# Patient Record
Sex: Female | Born: 1975 | Race: Black or African American | Hispanic: No | Marital: Single | Smoking: Never smoker
Health system: Southern US, Community
[De-identification: ages and names within clinical notes are randomized; demographics above are authoritative.]

## PROBLEM LIST (undated history)

## (undated) DIAGNOSIS — D509 Iron deficiency anemia, unspecified: Secondary | ICD-10-CM

## (undated) DIAGNOSIS — F321 Major depressive disorder, single episode, moderate: Secondary | ICD-10-CM

## (undated) DIAGNOSIS — N758 Other diseases of Bartholin's gland: Secondary | ICD-10-CM

## (undated) DIAGNOSIS — Z973 Presence of spectacles and contact lenses: Secondary | ICD-10-CM

## (undated) DIAGNOSIS — N921 Excessive and frequent menstruation with irregular cycle: Secondary | ICD-10-CM

## (undated) DIAGNOSIS — O24419 Gestational diabetes mellitus in pregnancy, unspecified control: Secondary | ICD-10-CM

## (undated) DIAGNOSIS — T8092XA Unspecified transfusion reaction, initial encounter: Secondary | ICD-10-CM

## (undated) DIAGNOSIS — F419 Anxiety disorder, unspecified: Secondary | ICD-10-CM

## (undated) DIAGNOSIS — D72819 Decreased white blood cell count, unspecified: Secondary | ICD-10-CM

## (undated) DIAGNOSIS — T7840XA Allergy, unspecified, initial encounter: Secondary | ICD-10-CM

## (undated) DIAGNOSIS — J45909 Unspecified asthma, uncomplicated: Secondary | ICD-10-CM

## (undated) DIAGNOSIS — Z5189 Encounter for other specified aftercare: Secondary | ICD-10-CM

## (undated) HISTORY — DX: Gestational diabetes mellitus in pregnancy, unspecified control: O24.419

## (undated) HISTORY — PX: COMBINED HYSTEROSCOPY DIAGNOSTIC / D&C: SUR297

## (undated) HISTORY — DX: Iron deficiency anemia, unspecified: D50.9

## (undated) HISTORY — DX: Unspecified asthma, uncomplicated: J45.909

## (undated) HISTORY — DX: Allergy, unspecified, initial encounter: T78.40XA

## (undated) HISTORY — DX: Encounter for other specified aftercare: Z51.89

## (undated) HISTORY — DX: Anxiety disorder, unspecified: F41.9

---

## 1997-03-24 HISTORY — PX: DILATION AND CURETTAGE OF UTERUS: SHX78

## 2002-08-29 ENCOUNTER — Inpatient Hospital Stay (HOSPITAL_COMMUNITY): Admission: EM | Admit: 2002-08-29 | Discharge: 2002-09-01 | Payer: Self-pay | Admitting: Psychiatry

## 2002-08-29 ENCOUNTER — Encounter: Payer: Self-pay | Admitting: Emergency Medicine

## 2002-08-31 ENCOUNTER — Emergency Department (HOSPITAL_COMMUNITY): Admission: EM | Admit: 2002-08-31 | Discharge: 2002-08-31 | Payer: Self-pay | Admitting: Emergency Medicine

## 2002-11-15 ENCOUNTER — Other Ambulatory Visit: Admission: RE | Admit: 2002-11-15 | Discharge: 2002-11-15 | Payer: Self-pay | Admitting: Family Medicine

## 2002-11-21 ENCOUNTER — Ambulatory Visit (HOSPITAL_COMMUNITY): Admission: RE | Admit: 2002-11-21 | Discharge: 2002-11-21 | Payer: Self-pay | Admitting: Family Medicine

## 2002-11-21 ENCOUNTER — Encounter: Payer: Self-pay | Admitting: Family Medicine

## 2003-05-29 ENCOUNTER — Emergency Department (HOSPITAL_COMMUNITY): Admission: EM | Admit: 2003-05-29 | Discharge: 2003-05-29 | Payer: Self-pay | Admitting: Emergency Medicine

## 2004-05-08 ENCOUNTER — Other Ambulatory Visit: Admission: RE | Admit: 2004-05-08 | Discharge: 2004-05-08 | Payer: Self-pay | Admitting: Obstetrics and Gynecology

## 2004-05-31 ENCOUNTER — Ambulatory Visit (HOSPITAL_COMMUNITY): Admission: RE | Admit: 2004-05-31 | Discharge: 2004-05-31 | Payer: Self-pay | Admitting: Obstetrics and Gynecology

## 2004-08-02 ENCOUNTER — Encounter (HOSPITAL_COMMUNITY): Admission: AD | Admit: 2004-08-02 | Discharge: 2004-09-01 | Payer: Self-pay | Admitting: Obstetrics and Gynecology

## 2004-08-26 ENCOUNTER — Ambulatory Visit (HOSPITAL_COMMUNITY): Admission: RE | Admit: 2004-08-26 | Discharge: 2004-08-26 | Payer: Self-pay | Admitting: Obstetrics and Gynecology

## 2004-09-06 ENCOUNTER — Encounter (HOSPITAL_COMMUNITY): Admission: AD | Admit: 2004-09-06 | Discharge: 2004-10-06 | Payer: Self-pay | Admitting: Obstetrics and Gynecology

## 2004-10-04 ENCOUNTER — Inpatient Hospital Stay (HOSPITAL_COMMUNITY): Admission: AD | Admit: 2004-10-04 | Discharge: 2004-10-04 | Payer: Self-pay | Admitting: Obstetrics and Gynecology

## 2004-10-11 ENCOUNTER — Inpatient Hospital Stay: Admission: AD | Admit: 2004-10-11 | Discharge: 2004-10-11 | Payer: Self-pay | Admitting: Obstetrics and Gynecology

## 2004-10-18 ENCOUNTER — Encounter (HOSPITAL_COMMUNITY): Admission: RE | Admit: 2004-10-18 | Discharge: 2004-11-17 | Payer: Self-pay | Admitting: Obstetrics and Gynecology

## 2004-11-22 ENCOUNTER — Encounter (HOSPITAL_COMMUNITY): Admission: AD | Admit: 2004-11-22 | Discharge: 2004-12-13 | Payer: Self-pay | Admitting: Obstetrics and Gynecology

## 2005-01-17 ENCOUNTER — Encounter (INDEPENDENT_AMBULATORY_CARE_PROVIDER_SITE_OTHER): Payer: Self-pay | Admitting: *Deleted

## 2005-01-17 ENCOUNTER — Inpatient Hospital Stay (HOSPITAL_COMMUNITY): Admission: RE | Admit: 2005-01-17 | Discharge: 2005-01-20 | Payer: Self-pay | Admitting: Obstetrics and Gynecology

## 2009-07-15 ENCOUNTER — Emergency Department (HOSPITAL_COMMUNITY)
Admission: EM | Admit: 2009-07-15 | Discharge: 2009-07-16 | Payer: Self-pay | Source: Home / Self Care | Admitting: Emergency Medicine

## 2009-07-16 ENCOUNTER — Emergency Department (HOSPITAL_COMMUNITY)
Admission: EM | Admit: 2009-07-16 | Discharge: 2009-07-16 | Payer: Self-pay | Source: Home / Self Care | Admitting: Family Medicine
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
## 2010-08-09 NOTE — H&P (Signed)
[name], [name]               ACCOUNT NO.:  [id number]   MEDICAL RECORD NO.:  [id number]          PATIENT TYPE:  INP   LOCATION:  NA                            [facility]  [facility]   PHYSICIAN:  Charles A. Delcambre, MDDATE OF BIRTH:  09-13-1975   DATE OF ADMISSION:  DATE OF DISCHARGE:                                HISTORY & PHYSICAL   The patient is to be admitted on 01/17/05 to undergo repeat cesarean section  with tubal ligation.  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  I followed up with Dr. __________  through 36 weeks and neonatal testing with NST twice a week and biophysical  profile once a week.  She gives informed consent except for some infection,  bleeding, bowel and bladder damage, blood product risk including hepatitis  and HIV exposure, ureteral damage, failure risk of tubal ligation  approximately 1 in 400.  Alternative forms of contraception have been  discussed.  She is aware of permits by design of this procedure and  irreversibility by design.  All questions are answered.  She gives informed  consent in this regard.   PAST MEDICAL HISTORY:  History of gestational diabetes with prior pregnancy,  not currently.   PAST SURGICAL HISTORY:  Previous cesarean section x2, SVD x2, induction of  labor for fetal anomaly x1.   MEDICATIONS:  Prenatal vitamins plus iron.   ALLERGIES:  __________, reaction not specified.   SOCIAL HISTORY:  Married.  No tobacco, ethanol, or drug use.   FAMILY HISTORY:  Chronic hypertension, tuberculosis, diabetes.  Otherwise,  no major illnesses.   REVIEW OF SYSTEMS:  No chest pain, shortness of breath, wheezing, diarrhea,  or constipation, urge, frequency, dysuria.  No chest pain, no headaches,  scotomata, right upper quadrant pain, blurred  vision.   PHYSICAL EXAMINATION:  Alert and oriented x3.  Weight 249 pounds.  Blood  pressure 124/60, respirations 18, pulse 90, fetal heart rate today 140s.  Fundal height 37.  HEENT exam grossly within normal limits.  Neck is supple  without thyromegaly or adenopathy.  Lungs are clear bilaterally.  Back:  No  CVA tenderness.  Breasts:  No masses, tenderness, or discharge, skin or  nipple changes bilaterally.  Abdomen:  Fundal height of 37 and gravid.  Perineum normal external female genitalia.  Cervix closed and posterior on  examination today.  Soft.  Extremities with mild edema bilaterally.   ASSESSMENT:  Intrauterine pregnancy at 39 weeks.  Plan for date of delivery.  Previous cesarean section x2.  Desires sterilization.   PLAN:  Admit.  Group B strep is negative.  Plan repeat cesarean section with  tubal ligation.  State papers are signed for tubal ligation 09/04/04.  All  questions are answered.  We will proceed as outlined.  N.P.O. past midnight  the evening prior to surgery.  We will proceed as outlined.      Charles A. Sydnee Cabal, MD  Electronically Signed  CAD/MEDQ  D:  01/09/2005  T:  01/09/2005  Job:  [id number]

## 2010-08-09 NOTE — Op Note (Signed)
Meghan [name], Meghan [name]               ACCOUNT NO.:  [id number]   MEDICAL RECORD NO.:  [id number]          PATIENT TYPE:  INP   LOCATION:  9132                          [facility]ILITY:  [facility]   PHYSICIAN:  Charles A. Delcambre, MDDATE OF BIRTH:  03/10/1976   DATE OF PROCEDURE:  01/17/2005  DATE OF DISCHARGE:                                 OPERATIVE REPORT   PREOPERATIVE DIAGNOSES:  1.  Term intrauterine pregnancy.  2.  Undesired fertility.  3.  Previous cesarean section x2.   POSTOPERATIVE DIAGNOSES:  1.  Term intrauterine pregnancy.  2.  Undesired fertility.  3.  Previous cesarean section x2.   OPERATION/PROCEDURE:  1.  Repeat low transverse cesarean section.  2.  Bilateral tubal ligation, modified Pomeroy type.   SURGEON:  Charles A. Sydnee Cabal, M.D.   ASSISTANT:  Beulah Gandy. Ashley Royalty, M.D.   COMPLICATIONS:  Nuchal cord x1.   SPECIMENS:  Portion of right and left fallopian tube to pathology.   ANESTHESIA:  Spinal.   ESTIMATED BLOOD LOSS:  500 mL.   OPERATIVE FINDINGS:  Vigorous female, Apgars 9 and 9.  Three-vessel cord.   COUNTS:  Instrument, sponge and needle counts correct x2.   DESCRIPTION OF PROCEDURE:  The patient was taken to the operating room,  placed in the supine position.  After spinal was placed, anesthesia was  adequate.  Sterile prep and drape was undertaken.  A Pfannenstiel incision  was made with the knife and carried down to the fascia.  The fascia was  incised with the knife and Mayo scissors.  Rectus muscles were separated  bluntly.  Peritoneum was entered bluntly and incised with Metzenbaum  scissors without damage to the bladder or bowel or vascular structures.  Bladder blade was placed.  The lower uterine segment was incised after minor  dissection of the vesicouterine peritoneum with the Metzenbaum scissors.  Did develop the bladder flap.  Clear amniotic fluid was noted. Entry to the  intrauterine cavity was made without damage to the infant.  Traction  was  used to extend the incision.  Occiput was lifted into the incision site.  Fundal pressure was placed by the operator assistant.  Nuchal cord was  reduced.  Infant was delivered without any difficulty.  Cord was cut.  The  infant was shown to the parents and handed off to neonatologist, Dr. Eric Form,  who is in attendance for the delivery.  Placenta was manually expressed and  handed off to nurses to go for cord blood registry delivery draw.  After  cord blood was drawn, uterus was exteriorized for repair.  Internal surface  of the uterus was wiped with a moistened lap and #1 chromic was used to  close in two layers.  First layer with running locking, second layer running  imbricating, nonlocking.  Hemostasis was adequate.   Attention was given to tubal ligation.  Isthmic portion was doubly ligated  bilaterally with two segments of the 0 plain gut.  Tubal segment was excised  and sent to pathology separately and labeled right and left.  Hemostasis of  each pedicle on either side was  excellent.   Uterus was reinternalized.  Irrigation was carried out.  Uterine incision  had tubal pedicles on either side verified to be of excellent hemostasis.  Fascia was closed with #1 Vicryl running, nonlocking suture.  Subcutaneous 2-  0 Vicryl was used to close the subcutaneous layer.  Sterile skin clips were  used to close the skin.  Sterile dressing was applied.  The patient  tolerated the procedure well.  The patient was taken to recovery with the  physician in attendance.      Charles A. Sydnee Cabal, MD  Electronically Signed     CAD/MEDQ  D:  01/17/2005  T:  01/17/2005  Job:  [id number]

## 2010-08-09 NOTE — Discharge Summary (Signed)
Meghan [name], Meghan [name]               ACCOUNT NO.:  [id number]   MEDICAL RECORD NO.:  [id number]          PATIENT TYPE:  INP   LOCATION:  9132                          [facility]   PHYSICIAN:  Charles A. Delcambre, MDDATE OF BIRTH:  1975-08-11   DATE OF ADMISSION:  01/17/2005  DATE OF DISCHARGE:  01/20/2005                                 DISCHARGE SUMMARY   PRIMARY DISCHARGE DIAGNOSES:  1.  Intrauterine pregnancy at 39 weeks.  2.  Undesired fertility.  3.  Previous Cesarean section X2.   PROCEDURE:  Repeat low transverse Cesarean section with modified Pomeroy  bilateral tubal ligation.   HISTORY OF PRESENT ILLNESS:  History and physical is on the chart.   LABORATORY DATA:  Postoperative hematocrit 33.5, hemoglobin 11.2.  Pathology  is pending for right and left portion of fallopian tubes.   HOSPITAL COURSE:  The patient was admitted and underwent surgery as noted  above.  She had delivery of vigorous female with Apgar's of 9 and 9.  Baby  was 3665 grams, 52 cm.  Postoperatively the patient had routine  postoperative course. She had good pain control with Duramorph.  Postoperative day #1 she had flatus return and was given a general diet.  Catheter was discontinued.  She voided without difficulty.  She had good  bowel function continued.  She was ambulating on day 2 and continued with  routine postoperative care.  Day #3 was discharged home with follow up as  noted above.   DISPOSITION:  She was discharged home to follow up in two days to  discontinue staples.  She was given prescription for Percocet 5/325 one to  two p.o. q.4h. PRN #40, iron sulfate 325 mg one p.o. daily, #30.  She was  given convalescent instructions to rest at home.  She is to call for  temperature greater than 100 degrees, increased pain or bleeding, incisional  redness or drainage.  No driving for two weeks.  No heavy lifting greater  than 25 pounds for four weeks, shower okay but no bath for two weeks.   All  questions were answered and she will follow up as instructed.      Charles A. Sydnee Cabal, MD  Electronically Signed     CAD/MEDQ  D:  01/20/2005  T:  01/20/2005  Job:  [id number]

## 2011-03-18 ENCOUNTER — Encounter: Payer: Self-pay | Admitting: Emergency Medicine

## 2011-03-18 ENCOUNTER — Emergency Department (HOSPITAL_COMMUNITY)
Admission: EM | Admit: 2011-03-18 | Discharge: 2011-03-18 | Disposition: A | Discharge status: Home / Self Care | Payer: Self-pay | Attending: Emergency Medicine | Admitting: Emergency Medicine

## 2011-03-18 DIAGNOSIS — M5430 Sciatica, unspecified side: Secondary | ICD-10-CM | POA: Insufficient documentation

## 2011-03-18 DIAGNOSIS — M549 Dorsalgia, unspecified: Secondary | ICD-10-CM | POA: Insufficient documentation

## 2011-03-18 DIAGNOSIS — M5431 Sciatica, right side: Secondary | ICD-10-CM

## 2011-03-18 MED ORDER — PREDNISONE 20 MG PO TABS: 40.0000 mg | ORAL_TABLET | Freq: Every day | ORAL | Status: DC

## 2011-03-18 MED ORDER — DIAZEPAM 5 MG PO TABS
5.0000 mg | ORAL_TABLET | Freq: Once | ORAL | Status: AC
  Administered 2011-03-18: 5 mg via ORAL
  Filled 2011-03-18: qty 1

## 2011-03-18 MED ORDER — DIAZEPAM 5 MG PO TABS: 5.0000 mg | ORAL_TABLET | Freq: Two times a day (BID) | ORAL | Status: AC

## 2011-03-18 MED ORDER — PREDNISONE 20 MG PO TABS
60.0000 mg | ORAL_TABLET | Freq: Once | ORAL | Status: AC
  Administered 2011-03-18: 60 mg via ORAL
  Filled 2011-03-18: qty 2
  Filled 2011-03-18: qty 1

## 2011-03-18 MED ORDER — HYDROCODONE-ACETAMINOPHEN 5-325 MG PO TABS: 1.0000 | ORAL_TABLET | Freq: Four times a day (QID) | ORAL | Status: AC | PRN

## 2011-03-18 MED ORDER — HYDROCODONE-ACETAMINOPHEN 5-325 MG PO TABS
1.0000 | ORAL_TABLET | Freq: Once | ORAL | Status: AC
  Administered 2011-03-18: 1 via ORAL
  Filled 2011-03-18: qty 1

## 2011-03-18 NOTE — ED Provider Notes (Signed)
History     CSN: [id number]  Arrival date & time 03/18/11  2121   First MD Initiated Contact with Patient 03/18/11 2207      Chief Complaint  Patient presents with  . Back Pain    (Consider location/radiation/quality/duration/timing/severity/associated sxs/prior treatment) Patient is a 35 y.o. female presenting with back pain. The history is provided by the patient.  Back Pain  This is a new problem. The current episode started yesterday. The problem occurs constantly. The problem has not changed since onset.The pain is associated with no known injury. The pain is present in the lumbar spine. The pain radiates to the right thigh and right foot. The pain is at a severity of 8/10. The pain is moderate. The symptoms are aggravated by bending and certain positions. Associated symptoms include leg pain. Pertinent negatives include no fever, no numbness, no abdominal pain, no abdominal swelling, no bowel incontinence, no perianal numbness, no dysuria, no pelvic pain, no paresthesias, no tingling and no weakness. She has tried nothing for the symptoms.    Past Medical History  Diagnosis Date  . Anemia     Past Surgical History  Procedure Date  . Dilation and curettage of uterus   . Cesarean section     History reviewed. No pertinent family history.  History  Substance Use Topics  . Smoking status: Never Smoker   . Smokeless tobacco: Not on file  . Alcohol Use: No    OB History    Grav Para Term Preterm Abortions TAB SAB Ect Mult Living                  Review of Systems  Constitutional: Negative for fever and chills.  HENT: Negative.   Gastrointestinal: Negative for nausea, vomiting, abdominal pain and bowel incontinence.  Genitourinary: Negative for dysuria and pelvic pain.  Musculoskeletal: Positive for back pain.  Neurological: Negative for tingling, weakness, numbness and paresthesias.  Psychiatric/Behavioral: Negative.     Allergies  Review of patient's  allergies indicates no known allergies.  Home Medications  No current outpatient prescriptions on file.  BP 116/48  Pulse 75  Temp(Src) 98.8 F (37.1 C) (Oral)  Resp 18  SpO2 100%  LMP 03/12/2011  Physical Exam  Nursing note and vitals reviewed. Constitutional: She is oriented to person, place, and time. She appears well-developed and well-nourished. No distress.  HENT:  Head: Normocephalic.  Neck: Neck supple.  Cardiovascular: Normal rate, regular rhythm and normal heart sounds.   Pulmonary/Chest: Effort normal and breath sounds normal. No respiratory distress.  Abdominal: Soft. Bowel sounds are normal. There is no tenderness.  Musculoskeletal: Normal range of motion. She exhibits no edema.       No midline spine tenderness. Tender to palpation in right SI joint and right buttocks. Pain with right straight leg raise.   Neurological: She is alert and oriented to person, place, and time.       Normal sensation of bilateral thighs and perineum. 5/5 and equal LE stregth, pt able to dorsiflex bilateral feet and toes. 2+ bilateral patellar reflexes.   Skin: Skin is warm and dry.  Psychiatric: She has a normal mood and affect.    ED Course  Procedures (including critical care time)  Pt with signs and PE findings of sciatica. No signs of cuada equina or cord compression. Neurovascualrly intact. Will treat with steroids, pain medications, and orthopedics follow up if needed.   MDM          Lottie Mussel,  PA 03/18/11 2245

## 2011-03-18 NOTE — ED Notes (Signed)
Pt presented to the ER with c/o right lower back pain, 7/10, states s/s started yesterday morning, acute onset, pt states went to bed with out any problems, however, upon waking up, pt noted lower back pain, not baler to control or subside the pain, pt denies taking any medications for the pain, pt states applied heat pad to the site, minimal relief.

## 2011-03-19 NOTE — ED Provider Notes (Signed)
Medical screening examination/treatment/procedure(s) were performed by non-physician practitioner and as supervising physician I was immediately available for consultation/collaboration.  Hurman Horn, MD 03/19/11 [phone]

## 2011-12-24 ENCOUNTER — Encounter (HOSPITAL_COMMUNITY): Payer: Self-pay | Admitting: *Deleted

## 2011-12-24 ENCOUNTER — Emergency Department (HOSPITAL_COMMUNITY)
Admission: EM | Admit: 2011-12-24 | Discharge: 2011-12-25 | Disposition: A | Discharge status: Home / Self Care | Payer: 59 | Attending: Emergency Medicine | Admitting: Emergency Medicine

## 2011-12-24 DIAGNOSIS — G5600 Carpal tunnel syndrome, unspecified upper limb: Secondary | ICD-10-CM

## 2011-12-24 MED ORDER — IBUPROFEN 800 MG PO TABS: 800.0000 mg | ORAL_TABLET | Freq: Three times a day (TID) | ORAL | Status: DC | PRN

## 2011-12-24 MED ORDER — PREDNISONE 20 MG PO TABS: 20.0000 mg | ORAL_TABLET | Freq: Every day | ORAL | Status: AC

## 2011-12-24 NOTE — ED Provider Notes (Signed)
History     CSN: [id number]  Arrival date & time 12/24/11  2126   First MD Initiated Contact with Patient 12/24/11 2328      Chief Complaint  Patient presents with  . Arm Pain    (Consider location/radiation/quality/duration/timing/severity/associated sxs/prior treatment) HPI Comments: Patient reports she woke up this morning with tingling in her right arm that resolved, thought she slept on her arm wrong.  Then states she went to work and was typing, developed burning pain in her right wrist that radiated into her elbow, after this she states her fingers started to feel numb.  This resolved as well then she had two additional episodes of burning pain in her wrist and associated tingling in her fingers - one with attempting to start her car, the second with attempting to turn the key to unlock her house.  Reports continued burning in her wrist now and a dull/numb sensation in her hand and forearm.  Pain radiates up to her elbow. Pain is worse with movement. Denies fevers, trauma to her hand or arm, neck pain.    Patient is a 36 y.o. female presenting with arm pain. The history is provided by the patient.  Arm Pain Associated symptoms include numbness. Pertinent negatives include no fever, neck pain or weakness.    Past Medical History  Diagnosis Date  . Anemia     Past Surgical History  Procedure Date  . Dilation and curettage of uterus   . Cesarean section     No family history on file.  History  Substance Use Topics  . Smoking status: Never Smoker   . Smokeless tobacco: Not on file  . Alcohol Use: No    OB History    Grav Para Term Preterm Abortions TAB SAB Ect Mult Living                  Review of Systems  Constitutional: Negative for fever.  HENT: Negative for neck pain.   Neurological: Positive for numbness. Negative for weakness.    Allergies  Review of patient's allergies indicates no known allergies.  Home Medications   Current Outpatient Rx  Name  Route Sig Dispense Refill  . IBUPROFEN 200 MG PO TABS Oral Take 200 mg by mouth every 6 (six) hours as needed. Pain      BP 132/75  Pulse 63  Temp 98.1 F (36.7 C) (Oral)  Resp 18  SpO2 100%  LMP 12/24/2011  Physical Exam  Nursing note and vitals reviewed. Constitutional: She appears well-developed and well-nourished. No distress.  HENT:  Head: Normocephalic and atraumatic.  Neck: Neck supple.  Pulmonary/Chest: Effort normal.  Musculoskeletal:       Cervical back: Normal.       Thoracic back: She exhibits no bony tenderness.       Lumbar back: She exhibits no bony tenderness.       Positive tinel's and phalen's tests.  5/5 strength in right hand and arm, sensation intact (though pt reports this is decreased compared to left), distal pulses intact, capillary refill < 2 seconds.    Neurological: She is alert.  Skin: She is not diaphoretic.    ED Course  Procedures (including critical care time)  Labs Reviewed - No data to display No results found.   1. Carpal tunnel syndrome    MDM  Pt with apparent carpal tunnel syndrome on left.  No weakness.  No neck pain.  No injury.  Pt does a lot of typing at  work.  Has been told in the distant past she had carpal tunnel in that hand but has not had recent symptoms.  Pt placed in velcro wrist splint in ED, d/c home with NSAIDs and steroids per Up to Date recommendations.  PCP follow up (pt has health insurance and is able to find PCP for follow up).  Discussed diagnosis and treatment plan with patient.  Pt given return precautions.  Pt verbalizes understanding and agrees with plan.           English, [place] 12/25/11 [phone]

## 2011-12-24 NOTE — ED Notes (Signed)
Pt c/o right arm pain/numbness today; feels better tonight; no known injury

## 2011-12-24 NOTE — ED Notes (Signed)
Pt c/o right extremity pain from elbow to shoulder that started at 0930 today. Pt states she had difficult even turning on the ignition to her car, unlocking her door. Denies trauma. Pt has burning from wrist to elbow.

## 2011-12-25 NOTE — ED Provider Notes (Signed)
Medical screening examination/treatment/procedure(s) were performed by non-physician practitioner and as supervising physician I was immediately available for consultation/collaboration.   Charles B. Sheldon, MD 12/25/11 0619 

## 2012-07-19 ENCOUNTER — Emergency Department (HOSPITAL_COMMUNITY)
Admission: EM | Admit: 2012-07-19 | Discharge: 2012-07-20 | Disposition: A | Discharge status: Home / Self Care | Payer: 59 | Attending: Emergency Medicine | Admitting: Emergency Medicine

## 2012-07-19 ENCOUNTER — Encounter (HOSPITAL_COMMUNITY): Payer: Self-pay | Admitting: *Deleted

## 2012-07-19 DIAGNOSIS — Z3202 Encounter for pregnancy test, result negative: Secondary | ICD-10-CM | POA: Insufficient documentation

## 2012-07-19 DIAGNOSIS — D500 Iron deficiency anemia secondary to blood loss (chronic): Secondary | ICD-10-CM | POA: Insufficient documentation

## 2012-07-19 DIAGNOSIS — R102 Pelvic and perineal pain: Secondary | ICD-10-CM

## 2012-07-19 DIAGNOSIS — N949 Unspecified condition associated with female genital organs and menstrual cycle: Secondary | ICD-10-CM | POA: Insufficient documentation

## 2012-07-19 LAB — URINALYSIS, MICROSCOPIC ONLY
Glucose, UA: NEGATIVE mg/dL
Ketones, ur: NEGATIVE mg/dL
pH: 6.5 (ref 5.0–8.0)

## 2012-07-19 LAB — CBC WITH DIFFERENTIAL/PLATELET
Basophils Absolute: 0 10*3/uL (ref 0.0–0.1)
Lymphocytes Relative: 24 % (ref 12–46)
Neutro Abs: 1.6 10*3/uL — ABNORMAL LOW (ref 1.7–7.7)
Neutrophils Relative %: 57 % (ref 43–77)
Platelets: 450 10*3/uL — ABNORMAL HIGH (ref 150–400)
RDW: 30.7 % — ABNORMAL HIGH (ref 11.5–15.5)
WBC: 2.8 10*3/uL — ABNORMAL LOW (ref 4.0–10.5)

## 2012-07-19 LAB — COMPREHENSIVE METABOLIC PANEL
AST: 14 U/L (ref 0–37)
Albumin: 3.8 g/dL (ref 3.5–5.2)
Chloride: 104 mEq/L (ref 96–112)
Creatinine, Ser: 0.61 mg/dL (ref 0.50–1.10)
Potassium: 3.8 mEq/L (ref 3.5–5.1)
Total Bilirubin: 0.3 mg/dL (ref 0.3–1.2)

## 2012-07-19 LAB — POCT PREGNANCY, URINE: Preg Test, Ur: NEGATIVE

## 2012-07-19 NOTE — ED Notes (Signed)
Pt reports diffuse lower abd pain that began yesterday; pt denies N/V/D; pt states that the pain is worse today and is constant sharp pain.

## 2012-07-19 NOTE — ED Notes (Signed)
POCT PREG result NEG. 

## 2012-07-19 NOTE — ED Notes (Signed)
Dr. Read Drivers made aware of hemoglobin 5.6

## 2012-07-19 NOTE — ED Provider Notes (Addendum)
History     CSN: [id number]  Arrival date & time 07/19/12  2114   First MD Initiated Contact with Patient 07/19/12 2325      Chief Complaint  Patient presents with  . Abdominal Pain    (Consider location/radiation/quality/duration/timing/severity/associated sxs/prior treatment) HPI This is a 37 year old female with a history of anemia. She is here with abdominal pain that began yesterday. It was originally a sharp pain in the right upper quadrant that is now extended across the lower abdomen. She describes it as constant and almost like uterine cramps. There is no associated chills and has been on exertion. She denies chest pain, fever, nausea, vomiting, diarrhea, vaginal bleeding, vaginal discharge or dysuria. Pain is worse with movement, palpation or with Valsalva maneuver. The pain is moderate to severe.  Past Medical History  Diagnosis Date  . Anemia     Past Surgical History  Procedure Laterality Date  . Dilation and curettage of uterus    . Cesarean section      No family history on file.  History  Substance Use Topics  . Smoking status: Never Smoker   . Smokeless tobacco: Not on file  . Alcohol Use: Yes     Comment: occ    OB History   Grav Para Term Preterm Abortions TAB SAB Ect Mult Living                  Review of Systems  All other systems reviewed and are negative.    Allergies  Review of patient's allergies indicates no known allergies.  Home Medications   Current Outpatient Rx  Name  Route  Sig  Dispense  Refill  . ibuprofen (ADVIL,MOTRIN) 800 MG tablet   Oral   Take 1 tablet (800 mg total) by mouth every 8 (eight) hours as needed for pain.   21 tablet   0     BP 145/38  Pulse 78  Temp(Src) 97.5 F (36.4 C) (Oral)  Resp 18  Ht 5\' 7"  (1.702 m)  Wt 186 lb (84.369 kg)  BMI 29.12 kg/m2  SpO2 98%  LMP 07/14/2012  Physical Exam General: Well-developed, well-nourished female in no acute distress; appearance consistent with age of  record HENT: normocephalic, atraumatic Eyes: pupils equal round and reactive to light; extraocular muscles intact Neck: supple Heart: regular rate and rhythm; 2/6 systolic murmur Lungs: clear to auscultation bilaterally Abdomen: soft; nondistended; right upper quadrant and lower abdominal tenderness; no masses or hepatosplenomegaly; bowel sounds present GU: Normal external genitalia; watery vaginal discharge; vaginal bleeding; no cervical motion tenderness; right adnexal tenderness Extremities: No deformity; full range of motion Neurologic: Awake, alert and oriented; motor function intact in all extremities and symmetric; no facial droop Skin: Warm and dry Psychiatric: Normal mood and affect    ED Course  Procedures (including critical care time)    MDM   Nursing notes and vitals signs, including pulse oximetry, reviewed.  Summary of this visit's results, reviewed by myself:  Labs:  Results for orders placed during the hospital encounter of 07/19/12 (from the past 24 hour(s))  CBC WITH DIFFERENTIAL     Status: Abnormal   Collection Time    07/19/12 10:35 PM      Result Value Range   WBC 2.8 (*) 4.0 - 10.5 K/uL   RBC 3.20 (*) 3.87 - 5.11 MIL/uL   Hemoglobin 5.6 (*) 12.0 - 15.0 g/dL   HCT 84.6 (*) 96.2 - 95.2 %   MCV 66.6 (*) 78.0 - 100.0  fL   MCH 17.5 (*) 26.0 - 34.0 pg   MCHC 26.3 (*) 30.0 - 36.0 g/dL   RDW 47.8 (*) 29.5 - 62.1 %   Platelets 450 (*) 150 - 400 K/uL   Neutrophils Relative 57  43 - 77 %   Neutro Abs 1.6 (*) 1.7 - 7.7 K/uL   Lymphocytes Relative 24  12 - 46 %   Lymphs Abs 0.7  0.7 - 4.0 K/uL   Monocytes Relative 15 (*) 3 - 12 %   Monocytes Absolute 0.4  0.1 - 1.0 K/uL   Eosinophils Relative 3  0 - 5 %   Eosinophils Absolute 0.1  0.0 - 0.7 K/uL   Basophils Relative 1  0 - 1 %   Basophils Absolute 0.0  0.0 - 0.1 K/uL   Smear Review MORPHOLOGY UNREMARKABLE    COMPREHENSIVE METABOLIC PANEL     Status: Abnormal   Collection Time    07/19/12 10:35 PM       Result Value Range   Sodium 138  135 - 145 mEq/L   Potassium 3.8  3.5 - 5.1 mEq/L   Chloride 104  96 - 112 mEq/L   CO2 26  19 - 32 mEq/L   Glucose, Bld 106 (*) 70 - 99 mg/dL   BUN 10  6 - 23 mg/dL   Creatinine, Ser 3.08  0.50 - 1.10 mg/dL   Calcium 9.0  8.4 - 65.7 mg/dL   Total Protein 7.5  6.0 - 8.3 g/dL   Albumin 3.8  3.5 - 5.2 g/dL   AST 14  0 - 37 U/L   ALT 6  0 - 35 U/L   Alkaline Phosphatase 53  39 - 117 U/L   Total Bilirubin 0.3  0.3 - 1.2 mg/dL   GFR calc non Af Amer >90  >90 mL/min   GFR calc Af Amer >90  >90 mL/min  LIPASE, BLOOD     Status: None   Collection Time    07/19/12 10:35 PM      Result Value Range   Lipase 39  11 - 59 U/L  URINALYSIS, MICROSCOPIC ONLY     Status: Abnormal   Collection Time    07/19/12 10:42 PM      Result Value Range   Color, Urine YELLOW  YELLOW   APPearance CLOUDY (*) CLEAR   Specific Gravity, Urine 1.031 (*) 1.005 - 1.030   pH 6.5  5.0 - 8.0   Glucose, UA NEGATIVE  NEGATIVE mg/dL   Hgb urine dipstick NEGATIVE  NEGATIVE   Bilirubin Urine NEGATIVE  NEGATIVE   Ketones, ur NEGATIVE  NEGATIVE mg/dL   Protein, ur NEGATIVE  NEGATIVE mg/dL   Urobilinogen, UA 1.0  0.0 - 1.0 mg/dL   Nitrite NEGATIVE  NEGATIVE   Leukocytes, UA SMALL (*) NEGATIVE   WBC, UA 3-6  <3 WBC/hpf   Bacteria, UA RARE  RARE   Squamous Epithelial / LPF FEW (*) RARE  POCT PREGNANCY, URINE     Status: None   Collection Time    07/19/12 10:45 PM      Result Value Range   Preg Test, Ur NEGATIVE  NEGATIVE  WET PREP, GENITAL     Status: Abnormal   Collection Time    07/20/12  1:10 AM      Result Value Range   Yeast Wet Prep HPF POC NONE SEEN  NONE SEEN   Trich, Wet Prep NONE SEEN  NONE SEEN   Clue Cells Wet Prep  HPF POC FEW (*) NONE SEEN   WBC, Wet Prep HPF POC FEW (*) NONE SEEN    Imaging Studies: US Transvaginal Non-ob  08/03/2012  *RADIOLOGY REPORT*  Clinical Data:  Right adnexal pain.  History of tubal ligations.  TRANSABDOMINAL AND TRANSVAGINAL ULTRASOUND OF  PELVIS DOPPLER ULTRASOUND OF OVARIES  Technique:  Both transabdominal and transvaginal ultrasound examinations of the pelvis were performed. Transabdominal technique was performed for global imaging of the pelvis including uterus, ovaries, adnexal regions, and pelvic cul-de-sac.  It was necessary to proceed with endovaginal exam following the transabdominal exam to visualize the ovaries and endometrium.  Color and duplex Doppler ultrasound was utilized to evaluate blood flow to the ovaries.  Comparison:  None.  Findings:  Uterus:  The uterus is anteverted and measures 9 x 4.4 x 5.6 cm. No myometrial mass lesions.  Endometrium:  Endometrial stripe thickness is normal at 4 mm. Small fluid collection in the lower uterine segment measuring 1.1 x 0.5 x 1.2 cm.  This could represent physiologic fluid or endometrial polyp.  Cervix is unremarkable.  Right ovary: Right ovary measures 3.2 x 1.9 x 1.9 cm.  Normal follicular changes are demonstrated.  Flow is demonstrated within the right ovary on color flow Doppler imaging.  Left ovary:   Left ovary measures 3 x 2.1 x 1.8 cm.  Normal follicular changes are demonstrated.  Flow is demonstrated in the left ovary on color flow Doppler imaging.  Pulsed Doppler evaluation demonstrates normal low-resistance arterial and venous waveforms in both ovaries.  Small amount of free fluid in the pelvis.  IMPRESSION: Small amount of free fluid in the lower uterine segment endometrium could represent physiologic fluid or endometrial polyp.  Ovaries are unremarkable.  No abnormal adnexal masses.  No sonographic evidence for ovarian torsion.   Original Report Authenticated By: Burman Nieves, M.D.    US Pelvis Complete  2012-08-03  *RADIOLOGY REPORT*  Clinical Data:  Right adnexal pain.  History of tubal ligations.  TRANSABDOMINAL AND TRANSVAGINAL ULTRASOUND OF PELVIS DOPPLER ULTRASOUND OF OVARIES  Technique:  Both transabdominal and transvaginal ultrasound examinations of the pelvis were  performed. Transabdominal technique was performed for global imaging of the pelvis including uterus, ovaries, adnexal regions, and pelvic cul-de-sac.  It was necessary to proceed with endovaginal exam following the transabdominal exam to visualize the ovaries and endometrium.  Color and duplex Doppler ultrasound was utilized to evaluate blood flow to the ovaries.  Comparison:  None.  Findings:  Uterus:  The uterus is anteverted and measures 9 x 4.4 x 5.6 cm. No myometrial mass lesions.  Endometrium:  Endometrial stripe thickness is normal at 4 mm. Small fluid collection in the lower uterine segment measuring 1.1 x 0.5 x 1.2 cm.  This could represent physiologic fluid or endometrial polyp.  Cervix is unremarkable.  Right ovary: Right ovary measures 3.2 x 1.9 x 1.9 cm.  Normal follicular changes are demonstrated.  Flow is demonstrated within the right ovary on color flow Doppler imaging.  Left ovary:   Left ovary measures 3 x 2.1 x 1.8 cm.  Normal follicular changes are demonstrated.  Flow is demonstrated in the left ovary on color flow Doppler imaging.  Pulsed Doppler evaluation demonstrates normal low-resistance arterial and venous waveforms in both ovaries.  Small amount of free fluid in the pelvis.  IMPRESSION: Small amount of free fluid in the lower uterine segment endometrium could represent physiologic fluid or endometrial polyp.  Ovaries are unremarkable.  No abnormal adnexal masses.  No sonographic  evidence for ovarian torsion.   Original Report Authenticated By: Burman Nieves, M.D.    [place] Art/ven Flow Abd Pelv Doppler  07/20/2012  *RADIOLOGY REPORT*  Clinical Data:  Right adnexal pain.  History of tubal ligations.  TRANSABDOMINAL AND TRANSVAGINAL ULTRASOUND OF PELVIS DOPPLER ULTRASOUND OF OVARIES  Technique:  Both transabdominal and transvaginal ultrasound examinations of the pelvis were performed. Transabdominal technique was performed for global imaging of the pelvis including uterus, ovaries, adnexal  regions, and pelvic cul-de-sac.  It was necessary to proceed with endovaginal exam following the transabdominal exam to visualize the ovaries and endometrium.  Color and duplex Doppler ultrasound was utilized to evaluate blood flow to the ovaries.  Comparison:  None.  Findings:  Uterus:  The uterus is anteverted and measures 9 x 4.4 x 5.6 cm. No myometrial mass lesions.  Endometrium:  Endometrial stripe thickness is normal at 4 mm. Small fluid collection in the lower uterine segment measuring 1.1 x 0.5 x 1.2 cm.  This could represent physiologic fluid or endometrial polyp.  Cervix is unremarkable.  Right ovary: Right ovary measures 3.2 x 1.9 x 1.9 cm.  Normal follicular changes are demonstrated.  Flow is demonstrated within the right ovary on color flow Doppler imaging.  Left ovary:   Left ovary measures 3 x 2.1 x 1.8 cm.  Normal follicular changes are demonstrated.  Flow is demonstrated in the left ovary on color flow Doppler imaging.  Pulsed Doppler evaluation demonstrates normal low-resistance arterial and venous waveforms in both ovaries.  Small amount of free fluid in the pelvis.  IMPRESSION: Small amount of free fluid in the lower uterine segment endometrium could represent physiologic fluid or endometrial polyp.  Ovaries are unremarkable.  No abnormal adnexal masses.  No sonographic evidence for ovarian torsion.   Original Report Authenticated By: Burman Nieves, M.D.     4:16 AM Abdominal pain has nearly resolved. Her abdomen is soft with minimal right lower quadrant tenderness. She was advised that should her pain worsen over the next 12 hours she should return.  Patient's care discussed with the hospitalist. We will treat her significant iron deficiency anemia with iron and refer her to [facility] for treatment of her heavy menstrual periods, likely the cause of her chronic anemia.        Hanley Seamen, MD 07/20/12 1610  Hanley Seamen, MD 07/20/12 [phone]

## 2012-07-20 ENCOUNTER — Emergency Department (HOSPITAL_COMMUNITY): Payer: 59

## 2012-07-20 LAB — WET PREP, GENITAL

## 2012-07-20 MED ORDER — FENTANYL CITRATE 0.05 MG/ML IJ SOLN
100.0000 ug | Freq: Once | INTRAMUSCULAR | Status: AC
  Administered 2012-07-20: 100 ug via INTRAVENOUS
  Filled 2012-07-20: qty 2

## 2012-07-20 MED ORDER — FERROUS SULFATE 325 (65 FE) MG PO TABS: 325.0000 mg | ORAL_TABLET | Freq: Three times a day (TID) | ORAL | Status: DC

## 2013-01-04 ENCOUNTER — Emergency Department (HOSPITAL_COMMUNITY)
Admission: EM | Admit: 2013-01-04 | Discharge: 2013-01-04 | Disposition: A | Discharge status: Home / Self Care | Payer: 59 | Attending: Emergency Medicine | Admitting: Emergency Medicine

## 2013-01-04 ENCOUNTER — Emergency Department (HOSPITAL_COMMUNITY): Payer: 59

## 2013-01-04 ENCOUNTER — Encounter (HOSPITAL_COMMUNITY): Payer: Self-pay | Admitting: Emergency Medicine

## 2013-01-04 DIAGNOSIS — R0789 Other chest pain: Secondary | ICD-10-CM | POA: Insufficient documentation

## 2013-01-04 DIAGNOSIS — R079 Chest pain, unspecified: Secondary | ICD-10-CM

## 2013-01-04 DIAGNOSIS — D649 Anemia, unspecified: Secondary | ICD-10-CM | POA: Insufficient documentation

## 2013-01-04 DIAGNOSIS — Z79899 Other long term (current) drug therapy: Secondary | ICD-10-CM | POA: Insufficient documentation

## 2013-01-04 LAB — BASIC METABOLIC PANEL
BUN: 9 mg/dL (ref 6–23)
CO2: 25 mEq/L (ref 19–32)
Calcium: 9.3 mg/dL (ref 8.4–10.5)
Chloride: 101 mEq/L (ref 96–112)
Creatinine, Ser: 0.59 mg/dL (ref 0.50–1.10)
GFR calc Af Amer: >90 mL/min (ref >90)
GFR calc non Af Amer: >90 mL/min (ref >90)
Glucose, Bld: 99 mg/dL (ref 70–99)
Potassium: 3.7 mEq/L (ref 3.5–5.1)
Sodium: 135 mEq/L (ref 135–145)

## 2013-01-04 LAB — TROPONIN I: Troponin I: <0.3 ng/mL (ref <0.30)

## 2013-01-04 MED ORDER — NAPROXEN 375 MG PO TABS: 375.0000 mg | ORAL_TABLET | Freq: Two times a day (BID) | ORAL | Status: DC | PRN

## 2013-01-04 MED ORDER — IBUPROFEN 200 MG PO TABS
600.0000 mg | ORAL_TABLET | Freq: Once | ORAL | Status: AC
  Administered 2013-01-04: 600 mg via ORAL
  Filled 2013-01-04: qty 3

## 2013-01-04 MED ORDER — ASPIRIN EC 81 MG PO TBEC
81.0000 mg | DELAYED_RELEASE_TABLET | Freq: Once | ORAL | Status: AC
  Administered 2013-01-04: 81 mg via ORAL
  Filled 2013-01-04: qty 1

## 2013-01-04 NOTE — ED Notes (Signed)
Pt reports having chest pain to the L side that started last night. Pain is sharp in nature. Pt denies vomiting SOB lightheadedness or dizziness but reports her L arm not feeling right. BP mod elevated.

## 2013-01-04 NOTE — ED Provider Notes (Signed)
CSN: [id number]     Arrival date & time 01/04/13  1955 History   First MD Initiated Contact with Patient 01/04/13 2011     Chief Complaint  Patient presents with  . Chest Pain   (Consider location/radiation/quality/duration/timing/severity/associated sxs/prior Treatment) HPI  37 year old female with chest pain. Onset last night. Pain is intermittent. Describes as a sharp pain has since she is being stabbed with a meal. Pain is in the left anterior chest. Episodes vary in intensity. Sometimes relatively mild and at other times more severe. His episodes last anywhere from 2-5 minutes. She is not noticed any appreciable exacerbating or relieving factors. They do not seem to be precipitated by exertion. No fever or chills. No cough. No unusual leg pain or swelling. Denies any shortness of breath. No palpitations. No dizziness or lightheadedness. No history similar pain. No intervention prior to arrival. No recent surgery, prolonged immobilization, exogenous estrogen use or history of DVT.  Past Medical History  Diagnosis Date  . Anemia    Past Surgical History  Procedure Laterality Date  . Dilation and curettage of uterus    . Cesarean section     No family history on file. History  Substance Use Topics  . Smoking status: Never Smoker   . Smokeless tobacco: Not on file  . Alcohol Use: Yes     Comment: occ   OB History   Grav Para Term Preterm Abortions TAB SAB Ect Mult Living                 Review of Systems  All systems reviewed and negative, other than as noted in HPI.   Allergies  Review of patient's allergies indicates no known allergies.  Home Medications   Current Outpatient Rx  Name  Route  Sig  Dispense  Refill  . ferrous sulfate 325 (65 FE) MG tablet   Oral   Take 1 tablet (325 mg total) by mouth 3 (three) times daily with meals.   90 tablet   3    BP 158/68  Pulse 65  Temp(Src) 98.5 F (36.9 C) (Oral)  Resp 21  SpO2 100% Physical Exam  Nursing note  and vitals reviewed. Constitutional: She appears well-developed and well-nourished. No distress.  Laying in bed. NAD.   HENT:  Head: Normocephalic and atraumatic.  Eyes: Conjunctivae are normal. Right eye exhibits no discharge. Left eye exhibits no discharge.  Neck: Neck supple.  Cardiovascular: Normal rate, regular rhythm and normal heart sounds.  Exam reveals no gallop and no friction rub.   No murmur heard. Pulmonary/Chest: Effort normal and breath sounds normal. No respiratory distress. She exhibits no tenderness.  Abdominal: Soft. She exhibits no distension. There is no tenderness.  Musculoskeletal: She exhibits no edema and no tenderness.  Lower extremities symmetric as compared to each other. No calf tenderness. Negative Homan's. No palpable cords.   Neurological: She is alert.  Skin: Skin is warm and dry. She is not diaphoretic.  Psychiatric: She has a normal mood and affect. Her behavior is normal. Thought content normal.    ED Course  Procedures (including critical care time) Labs Review Labs Reviewed  CBC WITH DIFFERENTIAL - Abnormal; Notable for the following:    WBC 3.6 (*)    RBC 3.62 (*)    Hemoglobin 6.7 (*)    HCT 24.9 (*)    MCV 68.8 (*)    MCH 18.5 (*)    MCHC 26.9 (*)    RDW 29.6 (*)    Platelets  685 (*)    Basophils Relative 2 (*)    All other components within normal limits  BASIC METABOLIC PANEL  TROPONIN I   Imaging Review Dg Chest 2 View  01/04/2013   CLINICAL DATA:  Chest pain.  EXAM: CHEST  2 VIEW  COMPARISON:  None.  FINDINGS: The heart size and mediastinal contours are within normal limits. Both lungs are clear. The visualized skeletal structures are unremarkable.  IMPRESSION: No active cardiopulmonary disease.   Electronically Signed   By: Davonna Belling M.D.   On: 01/04/2013 20:57    EKG Interpretation     Ventricular Rate:  59 PR Interval:  111 QRS Duration: 82 QT Interval:  453 QTC Calculation: 449 R Axis:   64 Text Interpretation:   Sinus rhythm short PR interval NS ST changes No previous ECGs available            MDM   1. Chest pain      37 year old female with chest pain. Atypical for ACS. EKG with no ischemic changes. Doubt infectious, pulmonary embolism or dissection. Possibly musculoskeletal with patient reporting that she can reproduce the pain with palpation right underneath her left breast, although I cannot reproduce the same on my exam. Workup significant for anemia. Patient has a past history of previous severe anemia related to blood loss and iron deficiency. Her H&H is higher than previous labs for comparison. Her heart rate has remained consistently in the 60s and 70s and slightly hypertensive. This is not consistent with an acute process.     Raeford Razor, MD 01/07/13 [phone]

## 2013-01-05 LAB — CBC WITH DIFFERENTIAL/PLATELET
Basophils Absolute: 0.1 10*3/uL (ref 0.0–0.1)
Basophils Relative: 2 % — ABNORMAL HIGH (ref 0–1)
Eosinophils Absolute: 0.1 10*3/uL (ref 0.0–0.7)
Eosinophils Relative: 2 % (ref 0–5)
HCT: 24.9 % — ABNORMAL LOW (ref 36.0–46.0)
Hemoglobin: 6.7 g/dL — CL (ref 12.0–15.0)
Lymphocytes Relative: 19 % (ref 12–46)
Lymphs Abs: 0.7 10*3/uL (ref 0.7–4.0)
MCH: 18.5 pg — ABNORMAL LOW (ref 26.0–34.0)
MCHC: 26.9 g/dL — ABNORMAL LOW (ref 30.0–36.0)
MCV: 68.8 fL — ABNORMAL LOW (ref 78.0–100.0)
Monocytes Absolute: 0.4 10*3/uL (ref 0.1–1.0)
Monocytes Relative: 12 % (ref 3–12)
Neutro Abs: 2.3 10*3/uL (ref 1.7–7.7)
Neutrophils Relative %: 65 % (ref 43–77)
Platelets: 685 10*3/uL — ABNORMAL HIGH (ref 150–400)
RBC: 3.62 MIL/uL — ABNORMAL LOW (ref 3.87–5.11)
RDW: 29.6 % — ABNORMAL HIGH (ref 11.5–15.5)
WBC: 3.6 10*3/uL — ABNORMAL LOW (ref 4.0–10.5)

## 2013-04-15 ENCOUNTER — Other Ambulatory Visit: Payer: Self-pay

## 2013-04-19 ENCOUNTER — Telehealth: Payer: Self-pay | Admitting: Hematology and Oncology

## 2013-04-19 ENCOUNTER — Telehealth: Payer: Self-pay | Admitting: Oncology

## 2013-04-19 NOTE — Telephone Encounter (Signed)
C/D 04/19/13 for appt. 04/22/13

## 2013-04-19 NOTE — Telephone Encounter (Signed)
LVOM FOR PT TO RETURN CALL IN RE TO REFERRAL.  °

## 2013-04-19 NOTE — Telephone Encounter (Signed)
Patient return call to schedule appointment. Per patient she needed appt before 10:30 patient is scheduled to see Dr. Alvy Bimler 01/30 @ 9:30.  Referring Dr. [name] Dx- Pancytopenia Welcome Packet emailed

## 2013-04-22 ENCOUNTER — Ambulatory Visit (HOSPITAL_BASED_OUTPATIENT_CLINIC_OR_DEPARTMENT_OTHER): Payer: 59 | Admitting: Hematology and Oncology

## 2013-04-22 ENCOUNTER — Other Ambulatory Visit: Payer: Self-pay | Admitting: Medical Oncology

## 2013-04-22 ENCOUNTER — Ambulatory Visit (HOSPITAL_BASED_OUTPATIENT_CLINIC_OR_DEPARTMENT_OTHER): Payer: 59

## 2013-04-22 ENCOUNTER — Encounter: Payer: Self-pay | Admitting: Hematology and Oncology

## 2013-04-22 ENCOUNTER — Telehealth: Payer: Self-pay | Admitting: *Deleted

## 2013-04-22 ENCOUNTER — Ambulatory Visit (HOSPITAL_COMMUNITY)
Admission: RE | Admit: 2013-04-22 | Discharge: 2013-04-22 | Disposition: A | Discharge status: Home / Self Care | Payer: 59 | Source: Ambulatory Visit | Attending: Hematology and Oncology | Admitting: Hematology and Oncology

## 2013-04-22 ENCOUNTER — Ambulatory Visit: Payer: 59

## 2013-04-22 ENCOUNTER — Encounter: Payer: Self-pay | Admitting: Medical Oncology

## 2013-04-22 VITALS — BP 120/63 | HR 67 | Temp 97.7°F | Resp 16

## 2013-04-22 VITALS — BP 125/73 | HR 72 | Temp 98.2°F | Resp 20 | Ht 67.0 in | Wt 190.6 lb

## 2013-04-22 DIAGNOSIS — R5381 Other malaise: Secondary | ICD-10-CM

## 2013-04-22 DIAGNOSIS — D649 Anemia, unspecified: Secondary | ICD-10-CM

## 2013-04-22 DIAGNOSIS — D696 Thrombocytopenia, unspecified: Secondary | ICD-10-CM

## 2013-04-22 DIAGNOSIS — R5383 Other fatigue: Secondary | ICD-10-CM

## 2013-04-22 DIAGNOSIS — D72819 Decreased white blood cell count, unspecified: Secondary | ICD-10-CM

## 2013-04-22 DIAGNOSIS — R61 Generalized hyperhidrosis: Secondary | ICD-10-CM

## 2013-04-22 DIAGNOSIS — R51 Headache: Secondary | ICD-10-CM

## 2013-04-22 DIAGNOSIS — D61818 Other pancytopenia: Secondary | ICD-10-CM

## 2013-04-22 DIAGNOSIS — N920 Excessive and frequent menstruation with regular cycle: Secondary | ICD-10-CM

## 2013-04-22 LAB — CBC & DIFF AND RETIC
BASO%: 1.1 % (ref 0.0–2.0)
Basophils Absolute: 0 10*3/uL (ref 0.0–0.1)
EOS ABS: 0 10*3/uL (ref 0.0–0.5)
EOS%: 1.5 % (ref 0.0–7.0)
HCT: 24.1 % — ABNORMAL LOW (ref 34.8–46.6)
HGB: 7.1 g/dL — ABNORMAL LOW (ref 11.6–15.9)
Immature Retic Fract: 25.8 % — ABNORMAL HIGH (ref 1.60–10.00)
LYMPH#: 0.5 10*3/uL — AB (ref 0.9–3.3)
LYMPH%: 14.1 % (ref 14.0–49.7)
MCH: 20.2 pg — ABNORMAL LOW (ref 25.1–34.0)
MCHC: 29.7 g/dL — ABNORMAL LOW (ref 31.5–36.0)
MCV: 67.9 fL — ABNORMAL LOW (ref 79.5–101.0)
MONO#: 0.4 10*3/uL (ref 0.1–0.9)
MONO%: 11.4 % (ref 0.0–14.0)
NEUT#: 2.4 10*3/uL (ref 1.5–6.5)
NEUT%: 71.9 % (ref 38.4–76.8)
Platelets: 318 10*3/uL (ref 145–400)
RBC: 3.54 10*6/uL — AB (ref 3.70–5.45)
RDW: 34.3 % — ABNORMAL HIGH (ref 11.2–14.5)
RETIC %: 1.44 % (ref 0.70–2.10)
RETIC CT ABS: 50.98 10*3/uL (ref 33.70–90.70)
WBC: 3.3 10*3/uL — AB (ref 3.9–10.3)

## 2013-04-22 LAB — PROTHROMBIN TIME
INR: 1.08 (ref <1.50)
PROTHROMBIN TIME: 13.8 s (ref 11.6–15.2)

## 2013-04-22 LAB — COMPREHENSIVE METABOLIC PANEL (CC13)
ALBUMIN: 3.8 g/dL (ref 3.5–5.0)
ALK PHOS: 60 U/L (ref 40–150)
ALT: 11 U/L (ref 0–55)
AST: 18 U/L (ref 5–34)
Anion Gap: 8 mEq/L (ref 3–11)
BUN: 8.8 mg/dL (ref 7.0–26.0)
CALCIUM: 9 mg/dL (ref 8.4–10.4)
CHLORIDE: 108 meq/L (ref 98–109)
CO2: 25 mEq/L (ref 22–29)
Creatinine: 0.7 mg/dL (ref 0.6–1.1)
Glucose: 94 mg/dl (ref 70–140)
POTASSIUM: 3.7 meq/L (ref 3.5–5.1)
SODIUM: 140 meq/L (ref 136–145)
TOTAL PROTEIN: 7.6 g/dL (ref 6.4–8.3)
Total Bilirubin: 0.35 mg/dL (ref 0.20–1.20)

## 2013-04-22 LAB — PREPARE RBC (CROSSMATCH)

## 2013-04-22 LAB — HOLD TUBE, BLOOD BANK

## 2013-04-22 LAB — FIBRINOGEN: Fibrinogen: 304 mg/dL (ref 204–475)

## 2013-04-22 LAB — MORPHOLOGY: PLT EST: ADEQUATE

## 2013-04-22 LAB — LACTATE DEHYDROGENASE (CC13): LDH: 192 U/L (ref 125–245)

## 2013-04-22 LAB — ABO/RH: ABO/RH(D): O POS

## 2013-04-22 LAB — IRON AND TIBC CHCC
%SAT: 3 % — ABNORMAL LOW (ref 21–57)
IRON: 14 ug/dL — AB (ref 41–142)
TIBC: 429 ug/dL (ref 236–444)
UIBC: 415 ug/dL — ABNORMAL HIGH (ref 120–384)

## 2013-04-22 LAB — APTT: aPTT: 29.8 seconds (ref 24–37)

## 2013-04-22 LAB — CHCC SMEAR

## 2013-04-22 LAB — FERRITIN CHCC: Ferritin: 4 ng/ml — ABNORMAL LOW (ref 9–269)

## 2013-04-22 MED ORDER — SODIUM CHLORIDE 0.9 % IV SOLN
250.0000 mL | Freq: Once | INTRAVENOUS | Status: AC
  Administered 2013-04-22: 250 mL via INTRAVENOUS

## 2013-04-22 NOTE — Telephone Encounter (Signed)
Per staff message and POF I have scheduled appts.  JMW  

## 2013-04-22 NOTE — Patient Instructions (Signed)
Blood Transfusion Information WHAT IS A BLOOD TRANSFUSION? A transfusion is the replacement of blood or some of its parts. Blood is made up of multiple cells which provide different functions.  Red blood cells carry oxygen and are used for blood loss replacement.  White blood cells fight against infection.  Platelets control bleeding.  Plasma helps clot blood.  Other blood products are available for specialized needs, such as hemophilia or other clotting disorders. BEFORE THE TRANSFUSION  Who gives blood for transfusions?   You may be able to donate blood to be used at a later date on yourself (autologous donation).  Relatives can be asked to donate blood. This is generally not any safer than if you have received blood from a stranger. The same precautions are taken to ensure safety when a relative's blood is donated.  Healthy volunteers who are fully evaluated to make sure their blood is safe. This is blood bank blood. Transfusion therapy is the safest it has ever been in the practice of medicine. Before blood is taken from a donor, a complete history is taken to make sure that person has no history of diseases nor engages in risky social behavior (examples are intravenous drug use or sexual activity with multiple partners). The donor's travel history is screened to minimize risk of transmitting infections, such as malaria. The donated blood is tested for signs of infectious diseases, such as HIV and hepatitis. The blood is then tested to be sure it is compatible with you in order to minimize the chance of a transfusion reaction. If you or a relative donates blood, this is often done in anticipation of surgery and is not appropriate for emergency situations. It takes many days to process the donated blood. RISKS AND COMPLICATIONS Although transfusion therapy is very safe and saves many lives, the main dangers of transfusion include:   Getting an infectious disease.  Developing a  transfusion reaction. This is an allergic reaction to something in the blood you were given. Every precaution is taken to prevent this. The decision to have a blood transfusion has been considered carefully by your caregiver before blood is given. Blood is not given unless the benefits outweigh the risks. AFTER THE TRANSFUSION  Right after receiving a blood transfusion, you will usually feel much better and more energetic. This is especially true if your red blood cells have gotten low (anemic). The transfusion raises the level of the red blood cells which carry oxygen, and this usually causes an energy increase.  The nurse administering the transfusion will monitor you carefully for complications. HOME CARE INSTRUCTIONS  No special instructions are needed after a transfusion. You may find your energy is better. Speak with your caregiver about any limitations on activity for underlying diseases you may have. SEEK MEDICAL CARE IF:   Your condition is not improving after your transfusion.  You develop redness or irritation at the intravenous (IV) site. SEEK IMMEDIATE MEDICAL CARE IF:  Any of the following symptoms occur over the next 12 hours:  Shaking chills.  You have a temperature by mouth above 102 F (38.9 C), not controlled by medicine.  Chest, back, or muscle pain.  People around you feel you are not acting correctly or are confused.  Shortness of breath or difficulty breathing.  Dizziness and fainting.  You get a rash or develop hives.  You have a decrease in urine output.  Your urine turns a dark color or changes to pink, red, or brown. Any of the following   symptoms occur over the next 10 days:  You have a temperature by mouth above 102 F (38.9 C), not controlled by medicine.  Shortness of breath.  Weakness after normal activity.  The white part of the eye turns yellow (jaundice).  You have a decrease in the amount of urine or are urinating less often.  Your  urine turns a dark color or changes to pink, red, or brown. Document Released: 03/07/2000 Document Revised: 06/02/2011 Document Reviewed: 10/25/2007 Exit[employer] Patient Information 2014 [employer].  

## 2013-04-22 NOTE — Progress Notes (Signed)
Meghan [name] NOTE  Patient Care Team: [name], DO as PCP - General (Obstetrics and Gynecology)  CHIEF COMPLAINTS/PURPOSE OF CONSULTATION:  Severe pancytopenia  HISTORY OF PRESENTING ILLNESS:  Meghan [name] 38 y.o. female is here because of severe pancytopenia  She was found to have abnormal CBC from early 2014. CBC dated April 2014 review of mild leukopenia and severe microcytic anemia with associated thrombocytosis. The hemoglobin was as low as 5.6. She has been placed on iron supplement 3 times a day since then. Repeat CBC on 04/15/2013 shoulder, 2.1, hemoglobin of 7.2 and a platelet count of 31. She's been referred here because of this. She denies recent chest pain on exertion, shortness of breath on minimal exertion, or palpitations. She had a fainting episode after hot shower a month ago lasting 10-15 minutes. She has regular dizziness, severe headache and profound fatigue She had not noticed any recent bleeding such as epistaxis, hematuria or hematochezia. She does have heavy menstruation, bleeding between 5-7 days with cycle less than 30 days. The patient is taking over the counter NSAID ingestion. She is not on antiplatelets agents. She had no prior history or diagnosis of cancer. Her age appropriate screening programs are up-to-date. She has significant pica and chews a lot of ice daily. She eats a variety of diet. She never donated blood or received blood transfusion The patient was prescribed oral iron supplements and she takes 3 times a day for the past one year. She also have regular night sweats. She denies any smoking. She denies any history of recurrent infection.  MEDICAL HISTORY:  Past Medical History  Diagnosis Date  . Anemia   . Pancytopenia 04/22/2013    SURGICAL HISTORY: Past Surgical History  Procedure Laterality Date  . Dilation and curettage of uterus    . Cesarean section      SOCIAL HISTORY: History   Social History  .  Marital Status: Single    Spouse Name: N/A    Number of Children: N/A  . Years of Education: N/A   Occupational History  . Not on file.   Social History Main Topics  . Smoking status: Never Smoker   . Smokeless tobacco: Not on file  . Alcohol Use: Yes     Comment: occ  . Drug Use: Yes    Special: Marijuana  . Sexual Activity: Yes    Birth Control/ Protection: None   Other Topics Concern  . Not on file   Social History Narrative  . No narrative on file    FAMILY HISTORY: Family History  Problem Relation Age of Onset  . Cancer Maternal Grandmother     leukemia    ALLERGIES:  has No Known Allergies.  MEDICATIONS:  Current Outpatient Prescriptions  Medication Sig Dispense Refill  . ferrous sulfate 325 (65 FE) MG tablet Take 1 tablet (325 mg total) by mouth 3 (three) times daily with meals.  90 tablet  3  . naproxen (NAPROSYN) 375 MG tablet Take 1 tablet (375 mg total) by mouth 2 (two) times daily as needed.  20 tablet  0   No current facility-administered medications for this visit.    REVIEW OF SYSTEMS:   Constitutional: Denies fevers, chills  Eyes: Denies blurriness of vision, double vision or watery eyes Ears, nose, mouth, throat, and face: Denies mucositis or sore throat Respiratory: Denies cough, dyspnea or wheezes Cardiovascular: Denies palpitation, chest discomfort or lower extremity swelling Gastrointestinal:  Denies nausea, heartburn or change in bowel habits  Skin: Denies abnormal skin rashes Lymphatics: Denies new lymphadenopathy or easy bruising Neurological:Denies numbness, tingling or new weaknesses Behavioral/Psych: Mood is stable, no new changes  All other systems were reviewed with the patient and are negative.  PHYSICAL EXAMINATION: ECOG PERFORMANCE STATUS: 1 - Symptomatic but completely ambulatory  Filed Vitals:   04/22/13 0945  BP: 125/73  Pulse: 72  Temp: 98.2 F (36.8 C)  Resp: 20   Filed Weights   04/22/13 0945  Weight: 190 lb 9.6  oz (86.456 kg)    GENERAL:alert, no distress and comfortable. She looks pale SKIN: skin color, texture, turgor are normal, no rashes or significant lesions EYES: normal, conjunctiva are pale and non-injected, sclera clear OROPHARYNX:no exudate, no erythema and lips, buccal mucosa, and tongue normal  NECK: supple, thyroid normal size, non-tender, without nodularity LYMPH:  no palpable lymphadenopathy in the cervical, axillary or inguinal LUNGS: clear to auscultation and percussion with normal breathing effort HEART: regular rate & rhythm and no murmurs and no lower extremity edema ABDOMEN:abdomen soft, non-tender and normal bowel sounds Musculoskeletal:no cyanosis of digits and no clubbing  PSYCH: alert & oriented x 3 with fluent speech NEURO: no focal motor/sensory deficits  LABORATORY DATA:  I have reviewed the data as listed Recent Results (from the past 2160 hour(s))  [facility] SMEAR     Status: None   Collection Time    04/22/13 10:41 AM      Result Value Range   Smear Result Smear Available    MORPHOLOGY     Status: None   Collection Time    04/22/13 10:41 AM      Result Value Range   Polychromasia Slight  Slight   Tear Drop Cells Few  Negative   Ovalocytes Few  Negative   Helmet Cell Few  Negative   White Cell Comments C/W auto diff     PLT EST Adequate  Adequate   Platelet Morphology Large and giant platelets  Within Normal Limits  CBC & DIFF AND RETIC     Status: Abnormal   Collection Time    04/22/13 10:41 AM      Result Value Range   WBC 3.3 (*) 3.9 - 10.3 10e3/uL   NEUT# 2.4  1.5 - 6.5 10e3/uL   HGB 7.1 (*) 11.6 - 15.9 g/dL   HCT 24.1 (*) 34.8 - 46.6 %   Platelets 318  145 - 400 10e3/uL   MCV 67.9 (*) 79.5 - 101.0 fL   MCH 20.2 (*) 25.1 - 34.0 pg   MCHC 29.7 (*) 31.5 - 36.0 g/dL   RBC 3.54 (*) 3.70 - 5.45 10e6/uL   RDW 34.3 (*) 11.2 - 14.5 %   lymph# 0.5 (*) 0.9 - 3.3 10e3/uL   MONO# 0.4  0.1 - 0.9 10e3/uL   Eosinophils Absolute 0.0  0.0 - 0.5 10e3/uL    Basophils Absolute 0.0  0.0 - 0.1 10e3/uL   NEUT% 71.9  38.4 - 76.8 %   LYMPH% 14.1  14.0 - 49.7 %   MONO% 11.4  0.0 - 14.0 %   EOS% 1.5  0.0 - 7.0 %   BASO% 1.1  0.0 - 2.0 %   Retic % 1.44  0.70 - 2.10 %   Retic Ct Abs 50.98  33.70 - 90.70 10e3/uL   Immature Retic Fract 25.80 (*) 1.60 - 10.00 %  PROTHROMBIN TIME     Status: None   Collection Time    04/22/13 10:41 AM      Result Value  Range   Prothrombin Time 13.8  11.6 - 15.2 seconds   INR 1.08  <1.50   Comment: The INR is of principal utility in following patients on stable dosesof oral anticoagulants.  The therapeutic range is generally 2.0 to3.0, but may be 3.0 to 4.0 in patients with mechanical cardiac valves,recurrent embolisms and antiphospholipid      antibodies (including lupusinhibitors).  APTT     Status: None   Collection Time    04/22/13 10:41 AM      Result Value Range   aPTT 29.8  24 - 37 seconds   Comment: This test is for screening purposes only; it should not be used fortherapeutic unfractionated heparin monitoring.  Please refer toHeparin Anti-Xa (19379).  FIBRINOGEN     Status: None   Collection Time    04/22/13 10:41 AM      Result Value Range   Fibrinogen 304  204 - 475 mg/dL  TYPE AND SCREEN     Status: None   Collection Time    04/22/13 10:50 AM      Result Value Range   ABO/RH(D) O POS     Antibody Screen NEG     Sample Expiration 04/25/2013     Unit Number [id number]     Blood Component Type RED CELLS,LR     Unit division 00     Status of Unit ISSUED     Transfusion Status OK TO TRANSFUSE     Crossmatch Result Compatible    ABO/RH     Status: None   Collection Time    04/22/13 10:50 AM      Result Value Range   ABO/RH(D) O POS     I reviewed her peripheral blood smear we show significant hypochromic and microcytic anemia with abundant platelets. There is a significant anisocytosis. That is absolute reduction in white blood cell the overall normal morphology with nothing to suggest  blasts.   ASSESSMENT:  Severe anemia  PLAN #1 Anemia #2 severe fatigue #3 recent fainting episode The cause of the anemia is likely due to severe menorrhagia. I will order blood transfusion as the patient is symptomatic We discussed some of the risks, benefits, and alternatives of blood transfusions. The patient is symptomatic from anemia and the hemoglobin level is critically low.  Some of the side-effects to be expected including risks of transfusion reactions, chills, infection, syndrome of volume overload and risk of hospitalization from various reasons and the patient is willing to proceed and went ahead to sign consent today. I will proceed with only one unit of blood. At the time of dictation, I plan to give her iron infusion with the next visit. #4 leukopenia This is chronic in nature. I will order an additional workup to rule out autoimmune disease or infectious disease. #5 severe thrombocytopenia from outside records I verify with her peripheral smear that her platelet count is within normal limits #6 regular night sweats This could be a sign of symptomatic anemia. We will observe for now #7 severe headache This is due to symptomatic anemia. #8 severe menorrhagia I will order a coagulation studies to make sure that she does not have anything to suggest new coagulopathy. She would benefit from evaluation from gynecologist in the future.

## 2013-04-22 NOTE — Progress Notes (Signed)
Checked in new pt with no financial concerns. °

## 2013-04-25 ENCOUNTER — Telehealth: Payer: Self-pay | Admitting: *Deleted

## 2013-04-25 LAB — TYPE AND SCREEN
ABO/RH(D): O POS
Antibody Screen: NEGATIVE
Unit division: 0

## 2013-04-25 NOTE — Telephone Encounter (Signed)
Informed pt that Dr. Alvy Bimler says her cramping not related to the blood transfusion.  Instructed pt to contact her PCP or GYN if cramping gets worse.  Keep appt here in 2 days for lab and office visit as scheduled.  She verbalized understanding.

## 2013-04-25 NOTE — Telephone Encounter (Signed)
Pt reports started having menstrual type cramping about 2 hrs ago along w/ nausea about one hr ago.  Last menstrual cycle ended this past Thursday.  Denies any bleeding, just cramping. Although she did notice some blood on her tissue when she wiped after urinating yesterday morning.  Pt got blood transfusion this past Friday and asks if this could be related or anything to be concerned about?  She states just not "feeling right."

## 2013-04-26 LAB — ANA: ANA: NEGATIVE

## 2013-04-26 LAB — HEPATITIS B CORE ANTIBODY, IGM: Hep B C IgM: NONREACTIVE

## 2013-04-26 LAB — HEPATITIS C ANTIBODY: HCV AB: NEGATIVE

## 2013-04-26 LAB — VITAMIN B12: VITAMIN B 12: 711 pg/mL (ref 211–911)

## 2013-04-26 LAB — HIV ANTIBODY (ROUTINE TESTING W REFLEX): HIV: NONREACTIVE

## 2013-04-26 LAB — HEPATITIS B SURFACE ANTIGEN: Hepatitis B Surface Ag: NEGATIVE

## 2013-04-26 LAB — HEPATITIS B SURFACE ANTIBODY,QUALITATIVE: Hep B S Ab: NEGATIVE

## 2013-04-27 ENCOUNTER — Ambulatory Visit (HOSPITAL_BASED_OUTPATIENT_CLINIC_OR_DEPARTMENT_OTHER): Payer: 59

## 2013-04-27 ENCOUNTER — Ambulatory Visit (HOSPITAL_BASED_OUTPATIENT_CLINIC_OR_DEPARTMENT_OTHER): Payer: 59 | Admitting: Hematology and Oncology

## 2013-04-27 ENCOUNTER — Other Ambulatory Visit (HOSPITAL_BASED_OUTPATIENT_CLINIC_OR_DEPARTMENT_OTHER): Payer: 59

## 2013-04-27 ENCOUNTER — Encounter: Payer: Self-pay | Admitting: *Deleted

## 2013-04-27 ENCOUNTER — Encounter: Payer: Self-pay | Admitting: Hematology and Oncology

## 2013-04-27 VITALS — BP 177/87 | HR 59 | Temp 97.6°F | Resp 18 | Ht 67.0 in | Wt 191.9 lb

## 2013-04-27 DIAGNOSIS — F5089 Other specified eating disorder: Secondary | ICD-10-CM

## 2013-04-27 DIAGNOSIS — D509 Iron deficiency anemia, unspecified: Secondary | ICD-10-CM

## 2013-04-27 DIAGNOSIS — D473 Essential (hemorrhagic) thrombocythemia: Secondary | ICD-10-CM

## 2013-04-27 DIAGNOSIS — D61818 Other pancytopenia: Secondary | ICD-10-CM

## 2013-04-27 HISTORY — DX: Iron deficiency anemia, unspecified: D50.9

## 2013-04-27 LAB — CBC & DIFF AND RETIC
BASO%: 0.2 % (ref 0.0–2.0)
Basophils Absolute: 0 10*3/uL (ref 0.0–0.1)
EOS ABS: 0.1 10*3/uL (ref 0.0–0.5)
EOS%: 1.1 % (ref 0.0–7.0)
HCT: 29.5 % — ABNORMAL LOW (ref 34.8–46.6)
HGB: 8.8 g/dL — ABNORMAL LOW (ref 11.6–15.9)
IMMATURE RETIC FRACT: 19.1 % — AB (ref 1.60–10.00)
LYMPH%: 14 % (ref 14.0–49.7)
MCH: 21.1 pg — ABNORMAL LOW (ref 25.1–34.0)
MCHC: 29.7 g/dL — ABNORMAL LOW (ref 31.5–36.0)
MCV: 70.9 fL — ABNORMAL LOW (ref 79.5–101.0)
MONO#: 0.4 10*3/uL (ref 0.1–0.9)
MONO%: 9.9 % (ref 0.0–14.0)
NEUT%: 74.8 % (ref 38.4–76.8)
NEUTROS ABS: 3.4 10*3/uL (ref 1.5–6.5)
PLATELETS: 709 10*3/uL — AB (ref 145–400)
RBC: 4.16 10*6/uL (ref 3.70–5.45)
RDW: 34.2 % — AB (ref 11.2–14.5)
RETIC %: 0.74 % (ref 0.70–2.10)
Retic Ct Abs: 30.78 10*3/uL — ABNORMAL LOW (ref 33.70–90.70)
WBC: 4.5 10*3/uL (ref 3.9–10.3)
lymph#: 0.6 10*3/uL — ABNORMAL LOW (ref 0.9–3.3)

## 2013-04-27 LAB — HOLD TUBE, BLOOD BANK

## 2013-04-27 MED ORDER — SODIUM CHLORIDE 0.9 % IV SOLN
1020.0000 mg | Freq: Once | INTRAVENOUS | Status: AC
  Administered 2013-04-27: 1020 mg via INTRAVENOUS
  Filled 2013-04-27: qty 34

## 2013-04-27 MED ORDER — SODIUM CHLORIDE 0.9 % IV SOLN
Freq: Once | INTRAVENOUS | Status: AC
  Administered 2013-04-27: 14:00:00 via INTRAVENOUS

## 2013-04-27 NOTE — Patient Instructions (Signed)

## 2013-04-27 NOTE — Progress Notes (Signed)
Faxed signed/completed FMLA paperwork to pt's employer at [employer] at fax [phone].

## 2013-04-27 NOTE — Progress Notes (Signed)
[facility] OFFICE PROGRESS NOTE  Meghan Kenner, DO DIAGNOSIS:  Pancytopenia, iron deficiency  SUMMARY OF HEMATOLOGIC HISTORY: This is a pleasant 38 year old lady with severe abnormal CBC. She was noted to have severe iron deficiency anemia since 2014, with persistent anemia. She was referred here in January 2015 further workup. On 04/22/2013 she received one unit of blood transfusion for severe symptomatic anemia INTERVAL HISTORY: Meghan [name] 38 y.o. female returns for further followup. Her headaches has improved since the anemia. Denies any further headaches or dizziness. She still has excessive pica  I have reviewed the past medical history, past surgical history, social history and family history with the patient and they are unchanged from previous note.  ALLERGIES:  has No Known Allergies.  MEDICATIONS:  Current Outpatient Prescriptions  Medication Sig Dispense Refill  . ferrous sulfate 325 (65 FE) MG tablet Take 1 tablet (325 mg total) by mouth 3 (three) times daily with meals.  90 tablet  3  . naproxen (NAPROSYN) 375 MG tablet Take 1 tablet (375 mg total) by mouth 2 (two) times daily as needed.  20 tablet  0   No current facility-administered medications for this visit.     REVIEW OF SYSTEMS:   Behavioral/Psych: Mood is stable, no new changes  All other systems were reviewed with the patient and are negative.  PHYSICAL EXAMINATION: ECOG PERFORMANCE STATUS: 1 - Symptomatic but completely ambulatory  Filed Vitals:   04/27/13 1243  BP: 177/87  Pulse: 59  Temp: 97.6 F (36.4 C)  Resp: 18   Filed Weights   04/27/13 1243  Weight: 191 lb 14.4 oz (87.045 kg)    GENERAL:alert, no distress and comfortable SKIN: skin color, texture, turgor are normal, no rashes or significant lesions Musculoskeletal:no cyanosis of digits and no clubbing  NEURO: alert & oriented x 3 with fluent speech, no focal motor/sensory deficits  LABORATORY DATA:  I have  reviewed the data as listed Results for orders placed in visit on 04/27/13 (from the past 48 hour(s))  CBC & DIFF AND RETIC     Status: Abnormal   Collection Time    04/27/13 12:29 PM      Result Value Range   WBC 4.5  3.9 - 10.3 10e3/uL   NEUT# 3.4  1.5 - 6.5 10e3/uL   HGB 8.8 (*) 11.6 - 15.9 g/dL   HCT 29.5 (*) 34.8 - 46.6 %   Platelets 709 (*) 145 - 400 10e3/uL   MCV 70.9 (*) 79.5 - 101.0 fL   MCH 21.1 (*) 25.1 - 34.0 pg   MCHC 29.7 (*) 31.5 - 36.0 g/dL   RBC 4.16  3.70 - 5.45 10e6/uL   RDW 34.2 (*) 11.2 - 14.5 %   lymph# 0.6 (*) 0.9 - 3.3 10e3/uL   MONO# 0.4  0.1 - 0.9 10e3/uL   Eosinophils Absolute 0.1  0.0 - 0.5 10e3/uL   Basophils Absolute 0.0  0.0 - 0.1 10e3/uL   NEUT% 74.8  38.4 - 76.8 %   LYMPH% 14.0  14.0 - 49.7 %   MONO% 9.9  0.0 - 14.0 %   EOS% 1.1  0.0 - 7.0 %   BASO% 0.2  0.0 - 2.0 %   Retic % 0.74  0.70 - 2.10 %   Retic Ct Abs 30.78 (*) 33.70 - 90.70 10e3/uL   Immature Retic Fract 19.10 (*) 1.60 - 10.00 %    Lab Results  Component Value Date   WBC 4.5 04/27/2013   HGB  8.8* 04/27/2013   HCT 29.5* 04/27/2013   MCV 70.9* 04/27/2013   PLT 709* 04/27/2013   ASSESSMENT & PLAN:  #1 leukopenia, resolved Causes unknown. No further workup is needed #2 severe iron deficiency anemia The most likely cause of her anemia is due to chronic blood loss We discussed some of the risks, benefits, and alternatives of intravenous iron infusions. The patient is symptomatic from anemia and the iron level is critically low. She tolerated oral iron supplement poorly and desires to achieved higher levels of iron faster for adequate hematopoesis. Some of the side-effects to be expected including risks of infusion reactions, phlebitis, headaches, nausea and fatigue.  The patient is willing to proceed. Patient education material was dispensed.  Goal is to keep ferritin level greater than 50 #3 history of severe thrombocytopenia now thrombocytosis It is not clear to me why she had severe  thrombocytopenia and her physician office. I sm wondering whether it could be a lab error. The new thrombocytosis in today is likely due to iron deficiency. I recommend observation only #4 disability She brought along [facility] paperwork. I certified that she has to be seen here on 04/22/2013 and today to receive treatment.  All questions were answered. The patient knows to call the clinic with any problems, questions or concerns. No barriers to learning was detected.  I spent 25 minutes counseling the patient face to face. The total time spent in the appointment was 40 minutes and more than 50% was on counseling.     [facility], Jatinder Mcdonagh, MD 04/27/2013 1:04 PM

## 2013-04-28 ENCOUNTER — Telehealth: Payer: Self-pay | Admitting: *Deleted

## 2013-04-28 NOTE — Telephone Encounter (Signed)
Pt reports her FMLA paperwork was denied because there was no "condition" listed on it.  She will bring paperwork in again tomorrow for Dr. Alvy Bimler to complete with her "condition" listed.

## 2013-05-04 ENCOUNTER — Telehealth: Payer: Self-pay | Admitting: *Deleted

## 2013-05-04 NOTE — Telephone Encounter (Signed)
Pt dropped off FMLA paperwork which was previously completed by Dr. Alvy Bimler but was missing one area "condition" not filled out.   Dr. Alvy Bimler completed this section and paperwork left out front lobby file folder to be picked up.  Called pt and left her VM paperwork ready to pick up.

## 2013-05-25 ENCOUNTER — Encounter: Payer: Self-pay | Admitting: Hematology and Oncology

## 2013-05-25 ENCOUNTER — Encounter (INDEPENDENT_AMBULATORY_CARE_PROVIDER_SITE_OTHER): Payer: Self-pay

## 2013-05-25 ENCOUNTER — Other Ambulatory Visit (HOSPITAL_BASED_OUTPATIENT_CLINIC_OR_DEPARTMENT_OTHER): Payer: 59

## 2013-05-25 ENCOUNTER — Ambulatory Visit (HOSPITAL_BASED_OUTPATIENT_CLINIC_OR_DEPARTMENT_OTHER): Payer: 59 | Admitting: Hematology and Oncology

## 2013-05-25 VITALS — BP 157/76 | HR 63 | Temp 97.9°F | Resp 18 | Ht 67.0 in | Wt 197.8 lb

## 2013-05-25 DIAGNOSIS — N920 Excessive and frequent menstruation with regular cycle: Secondary | ICD-10-CM

## 2013-05-25 DIAGNOSIS — D509 Iron deficiency anemia, unspecified: Secondary | ICD-10-CM

## 2013-05-25 DIAGNOSIS — D72819 Decreased white blood cell count, unspecified: Secondary | ICD-10-CM

## 2013-05-25 DIAGNOSIS — D500 Iron deficiency anemia secondary to blood loss (chronic): Secondary | ICD-10-CM

## 2013-05-25 LAB — CBC & DIFF AND RETIC
BASO%: 0.3 % (ref 0.0–2.0)
Basophils Absolute: 0 10*3/uL (ref 0.0–0.1)
EOS ABS: 0.1 10*3/uL (ref 0.0–0.5)
EOS%: 1.7 % (ref 0.0–7.0)
HCT: 38 % (ref 34.8–46.6)
HGB: 11.5 g/dL — ABNORMAL LOW (ref 11.6–15.9)
Immature Retic Fract: 6.6 % (ref 1.60–10.00)
LYMPH#: 0.6 10*3/uL — AB (ref 0.9–3.3)
LYMPH%: 18.6 % (ref 14.0–49.7)
MCH: 25.6 pg (ref 25.1–34.0)
MCHC: 30.3 g/dL — AB (ref 31.5–36.0)
MCV: 84.6 fL (ref 79.5–101.0)
MONO#: 0.3 10*3/uL (ref 0.1–0.9)
MONO%: 9.8 % (ref 0.0–14.0)
NEUT%: 69.6 % (ref 38.4–76.8)
NEUTROS ABS: 2.1 10*3/uL (ref 1.5–6.5)
Platelets: 220 10*3/uL (ref 145–400)
RBC: 4.49 10*6/uL (ref 3.70–5.45)
RDW: 28.3 % — AB (ref 11.2–14.5)
RETIC CT ABS: 57.92 10*3/uL (ref 33.70–90.70)
Retic %: 1.29 % (ref 0.70–2.10)
WBC: 3 10*3/uL — AB (ref 3.9–10.3)

## 2013-05-25 LAB — FERRITIN CHCC: FERRITIN: 45 ng/mL (ref 9–269)

## 2013-05-25 NOTE — Progress Notes (Signed)
[facility] OFFICE PROGRESS NOTE  [name], DO DIAGNOSIS:  Severe iron deficiency anemia secondary to menorrhagia  SUMMARY OF HEMATOLOGIC HISTORY: This is a pleasant 38 year old lady with severe abnormal CBC. She was noted to have severe iron deficiency anemia since 2014, with persistent anemia. She was referred here in January 2015 further workup. On 04/22/2013 she received one unit of blood transfusion for severe symptomatic anemia. On 04/27/2013, she received one dose of iron feraheme. INTERVAL HISTORY: Meghan [name] 38 y.o. female returns for further followup. The patient has improvement of her symptoms. She had 1 fall due to some dizziness. Her excess of pica has resolved.  I have reviewed the past medical history, past surgical history, social history and family history with the patient and they are unchanged from previous note.  ALLERGIES:  has No Known Allergies.  MEDICATIONS:  Current Outpatient Prescriptions  Medication Sig Dispense Refill  . naproxen (NAPROSYN) 375 MG tablet Take 1 tablet (375 mg total) by mouth 2 (two) times daily as needed.  20 tablet  0   No current facility-administered medications for this visit.     REVIEW OF SYSTEMS:   Behavioral/Psych: Mood is stable, no new changes  All other systems were reviewed with the patient and are negative.  PHYSICAL EXAMINATION: ECOG PERFORMANCE STATUS: 0 - Asymptomatic  Filed Vitals:   05/25/13 1244  BP: 157/76  Pulse: 63  Temp: 97.9 F (36.6 C)  Resp: 18   Filed Weights   05/25/13 1244  Weight: 197 lb 12.8 oz (89.721 kg)    GENERAL:alert, no distress and comfortable Musculoskeletal:no cyanosis of digits and no clubbing  NEURO: alert & oriented x 3 with fluent speech, no focal motor/sensory deficits  LABORATORY DATA:  I have reviewed the data as listed Results for orders placed in visit on 05/25/13 (from the past 48 hour(s))  CBC & DIFF AND RETIC     Status: Abnormal   Collection Time    05/25/13 12:29 PM      Result Value Ref Range   WBC 3.0 (*) 3.9 - 10.3 10e3/uL   NEUT# 2.1  1.5 - 6.5 10e3/uL   HGB 11.5 (*) 11.6 - 15.9 g/dL   HCT 38.0  34.8 - 46.6 %   Platelets 220 Large & giant platelets  145 - 400 10e3/uL   MCV 84.6  79.5 - 101.0 fL   MCH 25.6  25.1 - 34.0 pg   MCHC 30.3 (*) 31.5 - 36.0 g/dL   RBC 4.49  3.70 - 5.45 10e6/uL   RDW 28.3 (*) 11.2 - 14.5 %   lymph# 0.6 (*) 0.9 - 3.3 10e3/uL   MONO# 0.3  0.1 - 0.9 10e3/uL   Eosinophils Absolute 0.1  0.0 - 0.5 10e3/uL   Basophils Absolute 0.0  0.0 - 0.1 10e3/uL   NEUT% 69.6  38.4 - 76.8 %   LYMPH% 18.6  14.0 - 49.7 %   MONO% 9.8  0.0 - 14.0 %   EOS% 1.7  0.0 - 7.0 %   BASO% 0.3  0.0 - 2.0 %   Retic % 1.29  0.70 - 2.10 %   Retic Ct Abs 57.92  33.70 - 90.70 10e3/uL   Immature Retic Fract 6.60  1.60 - 10.00 %    Lab Results  Component Value Date   WBC 3.0* 05/25/2013   HGB 11.5* 05/25/2013   HCT 38.0 05/25/2013   MCV 84.6 05/25/2013   PLT 220 Large & giant platelets 05/25/2013  ASSESSMENT & PLAN:  #1 chronic leukopenia I suspect this is benign. One month ago, her white count is normal. I recommend observation only #2 thrombocytosis, resolved This was due to iron deficiency and has since resolved #3 severe iron deficiency anemia, resolved I suspect this is due to menorrhagia. Her prior deficiency anemia has resolved since iron infusion. The patient has appointment to see gynecologist for further evaluation.  I plan to have another blood work repeated in 4 months and see her back in 8 months. All questions were answered. The patient knows to call the clinic with any problems, questions or concerns. No barriers to learning was detected.  I spent 15 minutes counseling the patient face to face. The total time spent in the appointment was 20 minutes and more than 50% was on counseling.     [place], [place], MD 05/25/2013 1:18 PM

## 2013-05-27 ENCOUNTER — Telehealth: Payer: Self-pay | Admitting: Hematology and Oncology

## 2013-05-27 NOTE — Telephone Encounter (Signed)
, °

## 2013-05-31 ENCOUNTER — Telehealth: Payer: Self-pay | Admitting: *Deleted

## 2013-05-31 NOTE — Telephone Encounter (Signed)
Pt brought in FMLA paperwork for Dr. Alvy Bimler to review.  It was denied and she asks if Dr. Alvy Bimler can review it again.   Placed in folder for Dr. Calton Dach review.

## 2013-06-01 ENCOUNTER — Telehealth: Payer: Self-pay | Admitting: *Deleted

## 2013-06-01 NOTE — Telephone Encounter (Signed)
FMLA paperwork corrected and initialed by dr. Alvy Bimler.  Faxed to [employer] at fax [phone].  Notified pt of FMLA paperwork done and faxed.  Copy left out front for pt to pick up at her convenience.  Pt verbalized understanding.

## 2013-07-13 ENCOUNTER — Encounter (HOSPITAL_COMMUNITY): Payer: Self-pay

## 2013-07-13 ENCOUNTER — Other Ambulatory Visit (HOSPITAL_COMMUNITY): Payer: 59 | Attending: Psychiatry | Admitting: Psychiatry

## 2013-07-13 DIAGNOSIS — IMO0002 Reserved for concepts with insufficient information to code with codable children: Secondary | ICD-10-CM | POA: Insufficient documentation

## 2013-07-13 DIAGNOSIS — F1210 Cannabis abuse, uncomplicated: Secondary | ICD-10-CM | POA: Insufficient documentation

## 2013-07-13 DIAGNOSIS — F332 Major depressive disorder, recurrent severe without psychotic features: Secondary | ICD-10-CM | POA: Insufficient documentation

## 2013-07-13 DIAGNOSIS — F4310 Post-traumatic stress disorder, unspecified: Secondary | ICD-10-CM | POA: Insufficient documentation

## 2013-07-13 DIAGNOSIS — F331 Major depressive disorder, recurrent, moderate: Secondary | ICD-10-CM

## 2013-07-13 DIAGNOSIS — F1010 Alcohol abuse, uncomplicated: Secondary | ICD-10-CM | POA: Insufficient documentation

## 2013-07-13 DIAGNOSIS — F411 Generalized anxiety disorder: Secondary | ICD-10-CM | POA: Insufficient documentation

## 2013-07-13 MED ORDER — MIRTAZAPINE 15 MG PO TABS: 15.0000 mg | ORAL_TABLET | Freq: Every day | ORAL | Status: DC
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
## 2013-07-14 ENCOUNTER — Other Ambulatory Visit (HOSPITAL_COMMUNITY): Payer: PRIVATE HEALTH INSURANCE | Attending: Psychiatry | Admitting: Psychiatry

## 2013-07-14 DIAGNOSIS — F410 Panic disorder [episodic paroxysmal anxiety] without agoraphobia: Secondary | ICD-10-CM | POA: Diagnosis not present

## 2013-07-14 DIAGNOSIS — F1010 Alcohol abuse, uncomplicated: Secondary | ICD-10-CM | POA: Insufficient documentation

## 2013-07-14 DIAGNOSIS — F411 Generalized anxiety disorder: Secondary | ICD-10-CM | POA: Diagnosis not present

## 2013-07-14 DIAGNOSIS — F17200 Nicotine dependence, unspecified, uncomplicated: Secondary | ICD-10-CM | POA: Diagnosis not present

## 2013-07-14 DIAGNOSIS — F1210 Cannabis abuse, uncomplicated: Secondary | ICD-10-CM | POA: Diagnosis not present

## 2013-07-14 DIAGNOSIS — F4310 Post-traumatic stress disorder, unspecified: Secondary | ICD-10-CM | POA: Insufficient documentation

## 2013-07-14 DIAGNOSIS — F332 Major depressive disorder, recurrent severe without psychotic features: Secondary | ICD-10-CM | POA: Insufficient documentation

## 2013-07-14 DIAGNOSIS — G4700 Insomnia, unspecified: Secondary | ICD-10-CM | POA: Diagnosis not present

## 2013-07-14 DIAGNOSIS — F331 Major depressive disorder, recurrent, moderate: Secondary | ICD-10-CM

## 2013-07-14 NOTE — Progress Notes (Signed)
    Daily Group Progress Note  Program: IOP  Group Time: 9:00-10:30  Participation Level: Active  Behavioral Response: Appropriate  Type of Therapy:  Group Therapy  Summary of Progress: Pt. Met with case manager and psychiatrist.     Group Time: 10:30-12:00  Participation Level:  Active  Behavioral Response: Appropriate  Type of Therapy: Psycho-education Group  Summary of Progress: Pt. Met with case manager and psychiatrist.  Meghan [name] B, COUNS 

## 2013-07-15 ENCOUNTER — Encounter (HOSPITAL_COMMUNITY): Payer: Self-pay | Admitting: Psychiatry

## 2013-07-15 ENCOUNTER — Other Ambulatory Visit (HOSPITAL_COMMUNITY): Payer: PRIVATE HEALTH INSURANCE | Admitting: Psychiatry

## 2013-07-15 DIAGNOSIS — F331 Major depressive disorder, recurrent, moderate: Secondary | ICD-10-CM

## 2013-07-15 DIAGNOSIS — F332 Major depressive disorder, recurrent severe without psychotic features: Secondary | ICD-10-CM | POA: Diagnosis not present

## 2013-07-15 NOTE — Progress Notes (Signed)
    Daily Group Progress Note  Program: IOP  Group Time: 9:00-10:30  Participation Level: Active  Behavioral Response: Appropriate  Type of Therapy:  Group Therapy  Summary of Progress: Pt. Participated in meditation exercise. Pt. Shared challenge of pattern of people pleasing and fears of disappointing others by asserting herself.     Group Time: 10:30-12:00  Participation Level:  Active  Behavioral Response: Appropriate  Type of Therapy: Psycho-education Group  Summary of Progress: Pt. Participated in discussion about passive, aggressive, and assertive communication styles.  [facility]

## 2013-07-15 NOTE — Progress Notes (Signed)
    Daily Group Progress Note  Program: IOP  Group Time: 9:00-10:30  Participation Level: Active  Behavioral Response: Appropriate  Type of Therapy:  Group Therapy  Summary of Progress: Pt. Participated in morning meditation. Pt. 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 Pt reports that she feels isolated at home at work, has thoughts that "the only one I can depend on is myself", suffers from social anxiety and low frustration tolerance for daily stressors.     Group Time: 10:30-12:00  Participation Level:  Active  Behavioral Response: Appropriate  Type of Therapy: Psycho-education Group  Summary of Progress: Pt. Participated in discussion about developing true self independent of the assessments of others.  Nancie Neas, COUNS

## 2013-07-18 ENCOUNTER — Other Ambulatory Visit (HOSPITAL_COMMUNITY): Payer: PRIVATE HEALTH INSURANCE | Admitting: Psychiatry

## 2013-07-18 ENCOUNTER — Encounter (HOSPITAL_COMMUNITY): Payer: Self-pay | Admitting: Psychiatry

## 2013-07-18 DIAGNOSIS — F331 Major depressive disorder, recurrent, moderate: Secondary | ICD-10-CM

## 2013-07-18 DIAGNOSIS — F332 Major depressive disorder, recurrent severe without psychotic features: Secondary | ICD-10-CM | POA: Diagnosis not present

## 2013-07-18 NOTE — Progress Notes (Signed)
    Daily Group Progress Note  Program: IOP  Group Time: 9:00-10:30  Participation Level: Active  Behavioral Response: Appropriate  Type of Therapy:  Group Therapy  Summary of Progress: Pt. Participated in meditation practice. Pt. Reported that she was "doing good". Pt. Appeared depressed during group, made little eye contact. Pt. Indicated some connection to relationship loss and pain of giving up hope of future reconciliation.     Group Time: 10:30-12:00  Participation Level:  Active  Behavioral Response: Appropriate  Type of Therapy: Psycho-education Group  Summary of Progress: Pt. Participated in grief and loss group facilitated by Jeanella Craze.  Nancie Neas, COUNS

## 2013-07-19 ENCOUNTER — Other Ambulatory Visit (HOSPITAL_COMMUNITY): Payer: PRIVATE HEALTH INSURANCE | Admitting: Psychiatry

## 2013-07-19 ENCOUNTER — Encounter (HOSPITAL_COMMUNITY): Payer: Self-pay | Admitting: Psychiatry

## 2013-07-19 DIAGNOSIS — F332 Major depressive disorder, recurrent severe without psychotic features: Secondary | ICD-10-CM | POA: Diagnosis not present

## 2013-07-19 DIAGNOSIS — F331 Major depressive disorder, recurrent, moderate: Secondary | ICD-10-CM

## 2013-07-19 NOTE — Progress Notes (Signed)
    Daily Group Progress Note  Program: IOP  Group Time: 9:00-10:30  Participation Level: Active  Behavioral Response: Appropriate  Type of Therapy:  Group Therapy  Summary of Progress: Pt. Participated in meditation exercise. Pt. Reported that she was feeing "so-so" and that she only slept 3 hours last night. Pt. Reported that she continues to be bothered by racing thoughts at night and critical thoughts about herself and tendency to rehearse past events in her head.      Group Time: 10:30-12:00  Participation Level:  Active  Behavioral Response: Appropriate  Type of Therapy: Psycho-education Group  Summary of Progress: Pt. Participated in discussion about developing creativity and facing fears related to creativity. Pt. Reported that she expresses herself through writing and poetry and creative projects with her daughter.  Nancie Neas, COUNS
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
## 2013-07-20 ENCOUNTER — Telehealth (HOSPITAL_COMMUNITY): Payer: Self-pay | Admitting: Psychiatry

## 2013-07-20 ENCOUNTER — Other Ambulatory Visit (HOSPITAL_COMMUNITY): Payer: PRIVATE HEALTH INSURANCE | Admitting: Psychiatry

## 2013-07-21 ENCOUNTER — Encounter (HOSPITAL_COMMUNITY): Payer: Self-pay | Admitting: Psychiatry

## 2013-07-21 ENCOUNTER — Other Ambulatory Visit (HOSPITAL_COMMUNITY): Payer: PRIVATE HEALTH INSURANCE | Admitting: Psychiatry

## 2013-07-21 DIAGNOSIS — F331 Major depressive disorder, recurrent, moderate: Secondary | ICD-10-CM

## 2013-07-21 DIAGNOSIS — F332 Major depressive disorder, recurrent severe without psychotic features: Secondary | ICD-10-CM | POA: Diagnosis not present

## 2013-07-21 NOTE — Progress Notes (Signed)
    Daily Group Progress Note  Program: IOP  Group Time: 9:00-10:30  Participation Level: Active  Behavioral Response: Appropriate  Type of Therapy:  Group Therapy  Summary of Progress: Pt. Participated in meditation exercise. Pt. Reported sadness and anger related to the unexpected end of her relationship with boyfriend, sadness when accompanied by thoughts of her father, critical thoughts about herself and contribute to her depression.     Group Time: 10:30-12:00  Participation Level:  Active  Behavioral Response: Appropriate  Type of Therapy: Psycho-education Group  Summary of Progress: Pt. Participated in discussion about the effects of loneliness, failure, rejection, and rumination on emotional health.  Nancie Neas, COUNS

## 2013-07-22 ENCOUNTER — Encounter (HOSPITAL_COMMUNITY): Payer: Self-pay | Admitting: Psychiatry

## 2013-07-22 ENCOUNTER — Other Ambulatory Visit (HOSPITAL_COMMUNITY): Payer: PRIVATE HEALTH INSURANCE | Attending: Psychiatry | Admitting: Psychiatry

## 2013-07-22 DIAGNOSIS — F411 Generalized anxiety disorder: Secondary | ICD-10-CM | POA: Insufficient documentation

## 2013-07-22 DIAGNOSIS — F1010 Alcohol abuse, uncomplicated: Secondary | ICD-10-CM | POA: Insufficient documentation

## 2013-07-22 DIAGNOSIS — G4700 Insomnia, unspecified: Secondary | ICD-10-CM | POA: Insufficient documentation

## 2013-07-22 DIAGNOSIS — F1210 Cannabis abuse, uncomplicated: Secondary | ICD-10-CM | POA: Diagnosis not present

## 2013-07-22 DIAGNOSIS — F410 Panic disorder [episodic paroxysmal anxiety] without agoraphobia: Secondary | ICD-10-CM | POA: Diagnosis not present

## 2013-07-22 DIAGNOSIS — F4310 Post-traumatic stress disorder, unspecified: Secondary | ICD-10-CM | POA: Diagnosis not present

## 2013-07-22 DIAGNOSIS — F17200 Nicotine dependence, unspecified, uncomplicated: Secondary | ICD-10-CM | POA: Insufficient documentation

## 2013-07-22 DIAGNOSIS — IMO0002 Reserved for concepts with insufficient information to code with codable children: Secondary | ICD-10-CM | POA: Diagnosis not present

## 2013-07-22 DIAGNOSIS — F332 Major depressive disorder, recurrent severe without psychotic features: Secondary | ICD-10-CM | POA: Insufficient documentation

## 2013-07-22 DIAGNOSIS — F331 Major depressive disorder, recurrent, moderate: Secondary | ICD-10-CM

## 2013-07-22 NOTE — Progress Notes (Signed)
    Daily Group Progress Note  Program: IOP  Group Time: 9:00-10:30  Participation Level: Active  Behavioral Response: Appropriate  Type of Therapy:  Group Therapy  Summary of Progress: Pt. Participated in meditation practice. Pt. Continues to reports attempts to control her thoughts in effort to stop ruminating. Pt. Reports that ruminating is causing problems with sleep. Pt. Encouraged to use distraction and/or meditation to assist with rumination.     Group Time: 10:30-12:00  Participation Level:  Active  Behavioral Response: Appropriate  Type of Therapy: Psycho-education Group  Summary of Progress: Pt. Participated in discussion about the neuropsychology of laughter and use of laughter as emotion regulation tool.  Nancie Neas, COUNS

## 2013-07-22 NOTE — Progress Notes (Signed)
Patient ID: Meghan [name], female   DOB: 06/12/1975, 38 y.o.   MRN: [id number] Patient seen face-to-face along with [name], states that she has difficulty turning her mind off at night and tends to ruminate about everything that's going on in her life. Discussed keeping a journal and writing all the things that bother her in the Journal and then forgiving herself before going to sleep patient stated understanding and is willing to do it. Appetite has improved, feels emotionally exhausted denies suicidal or homicidal ideation. States that her son is a big support and she is opening up more and talking in groups which helps. Denies suicidal or homicidal ideation and has no hallucinations or delusions. Tolerating her medications well

## 2013-07-25 ENCOUNTER — Other Ambulatory Visit (HOSPITAL_COMMUNITY): Payer: PRIVATE HEALTH INSURANCE | Admitting: Psychiatry

## 2013-07-25 ENCOUNTER — Encounter (HOSPITAL_COMMUNITY): Payer: Self-pay | Admitting: Psychiatry

## 2013-07-25 DIAGNOSIS — F331 Major depressive disorder, recurrent, moderate: Secondary | ICD-10-CM

## 2013-07-25 DIAGNOSIS — F332 Major depressive disorder, recurrent severe without psychotic features: Secondary | ICD-10-CM | POA: Diagnosis not present

## 2013-07-25 NOTE — Progress Notes (Signed)
    Daily Group Progress Note  Program: IOP  Group Time: 9:00-10:30  Participation Level: Active  Behavioral Response: Appropriate  Type of Therapy:  Group Therapy  Summary of Progress: Participated in morning meditation. Pt. Reported that she was feeing some anxiety "nerves rattled" this morning. Pt. Attributed anxiety to getting her children to school late. 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     Group Time: 10:30-12:00  Participation Level:  Active  Behavioral Response: Appropriate  Type of Therapy: Psycho-education Group  Summary of Progress: Pt. Participated in grief and loss group facilitated by Jeanella Craze.  [facility]

## 2013-07-26 ENCOUNTER — Other Ambulatory Visit (HOSPITAL_COMMUNITY): Payer: PRIVATE HEALTH INSURANCE | Admitting: Psychiatry

## 2013-07-26 ENCOUNTER — Encounter (HOSPITAL_COMMUNITY): Payer: Self-pay | Admitting: Psychiatry

## 2013-07-26 DIAGNOSIS — F332 Major depressive disorder, recurrent severe without psychotic features: Secondary | ICD-10-CM | POA: Diagnosis not present

## 2013-07-26 DIAGNOSIS — F331 Major depressive disorder, recurrent, moderate: Secondary | ICD-10-CM

## 2013-07-26 NOTE — Progress Notes (Signed)
    Daily Group Progress Note  Program: IOP  Group Time: 9:00-10:30  Participation Level: Active  Behavioral Response: Appropriate  Type of Therapy:  Group Therapy  Summary of Progress: Pt. Participated in meditation exercise. Pt. Reported that she is "doing good". Pt. Reported that she slept for 6 hours last night uninterrupted. Pt. Continues to process pattern of self-critical thoughts, anxiety related to return to work, and social anxiety in shopping places.     Group Time: 10:30-12:00  Participation Level:  Active  Behavioral Response: Appropriate  Type of Therapy: Psycho-education Group  Summary of Progress: Pt. Participated in discussion about finding meaning and identity from painful situations.  Nancie Neas, COUNS

## 2013-07-27 ENCOUNTER — Encounter (HOSPITAL_COMMUNITY): Payer: Self-pay | Admitting: Psychiatry

## 2013-07-27 ENCOUNTER — Other Ambulatory Visit (HOSPITAL_COMMUNITY): Payer: PRIVATE HEALTH INSURANCE | Admitting: Psychiatry

## 2013-07-27 DIAGNOSIS — F331 Major depressive disorder, recurrent, moderate: Secondary | ICD-10-CM

## 2013-07-27 DIAGNOSIS — F332 Major depressive disorder, recurrent severe without psychotic features: Secondary | ICD-10-CM | POA: Diagnosis not present

## 2013-07-27 NOTE — Progress Notes (Signed)
    Daily Group Progress Note  Program: IOP  Group Time: 9:00-10:30  Participation Level: Active  Behavioral Response: Appropriate  Type of Therapy:  Group Therapy  Summary of Progress: Pt. Participated in meditation exercise. Pt. Reported that "she was doing well" today. Pt. Shared challenge of grieving her father's death that happened 10 years ago this month. Pt. Shared that she continues to feel the pain of the loss especially near the anniversary of his death. Pt. 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     Group Time: 10:30-12:00  Participation Level:  Active  Behavioral Response: Appropriate  Type of Therapy: Psycho-education Group  Summary of Progress: Pt. Participated in discussion about the therapeutic significance of telling our stories and "bearing witness" to others as they tell their stories.  Nancie Neas, COUNS

## 2013-07-27 NOTE — Progress Notes (Signed)
Patient ID: Meghan [name], female   DOB: [birth year], 38 y.o.   MRN: [id number] Patient was seen with [name] states that she is sleeping better and has been sleeping up to 6 hours and feels more rested. Continues to be on Remeron 15 mg. Still continues to ruminate feels hopeless helpless with passive suicidal thoughts but no plan and is able to contract for safety. Patient reports she has not used alcohol in about 2 weeks and has decreased her cannabis intake significantly. Patient was complimented on her efforts. Will continue to monitor her and if her rumination continues will consider adding Risperdal. Denies homicidal ideation no hallucinations or delusions.

## 2013-07-28 ENCOUNTER — Other Ambulatory Visit (HOSPITAL_COMMUNITY): Payer: PRIVATE HEALTH INSURANCE | Admitting: Psychiatry

## 2013-07-29 ENCOUNTER — Other Ambulatory Visit (HOSPITAL_COMMUNITY): Payer: PRIVATE HEALTH INSURANCE | Admitting: Psychiatry

## 2013-07-29 ENCOUNTER — Encounter (HOSPITAL_COMMUNITY): Payer: Self-pay | Admitting: Psychiatry

## 2013-07-29 DIAGNOSIS — F331 Major depressive disorder, recurrent, moderate: Secondary | ICD-10-CM

## 2013-07-29 DIAGNOSIS — F332 Major depressive disorder, recurrent severe without psychotic features: Secondary | ICD-10-CM | POA: Diagnosis not present

## 2013-07-29 NOTE — Progress Notes (Signed)
    Daily Group Progress Note  Program: IOP  Group Time: 9:00-10:30  Participation Level: Active  Behavioral Response: Appropriate  Type of Therapy:  Group Therapy  Summary of Progress: Pt. Participated in meditation exercise. Pt. Reported that she was feeling "mixed emotions", happiness because her daughter was coming home from college and sadness because her cousin was killed in a car accident. Pt. 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     Group Time: 10:30-12:00  Participation Level:  Active  Behavioral Response: Appropriate  Type of Therapy: Psycho-education Group  Summary of Progress: Pt. Participated in discussion about distinguishing between emotions and components of mindfulness.  Nancie Neas, COUNS

## 2013-08-01 ENCOUNTER — Other Ambulatory Visit (HOSPITAL_COMMUNITY): Payer: PRIVATE HEALTH INSURANCE | Admitting: Psychiatry

## 2013-08-01 ENCOUNTER — Encounter (HOSPITAL_COMMUNITY): Payer: Self-pay | Admitting: Psychiatry

## 2013-08-01 DIAGNOSIS — F331 Major depressive disorder, recurrent, moderate: Secondary | ICD-10-CM

## 2013-08-01 DIAGNOSIS — F332 Major depressive disorder, recurrent severe without psychotic features: Secondary | ICD-10-CM | POA: Diagnosis not present

## 2013-08-01 NOTE — Progress Notes (Signed)
    Daily Group Progress Note  Program: IOP  Group Time: 9:00-10:30  Participation Level: Active  Behavioral Response: Appropriate  Type of Therapy:  Group Therapy  Summary of Progress: Pt. Participated in meditation exercise. Pt. Reported that she had "awesome weekend", she was able to attend son's travel basketball game with minimal anxiety, enjoyed visit from her daughter who was home from college. Pt. Reported that she had best Mother's Day in many years. Pt. Also reported that she slept well all weekend which she attributed to taking medication as prescribed, limiting caffeine, and using the breathing exercises. 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     Group Time: 10:30-12:00  Participation Level:  Active  Behavioral Response: Appropriate  Type of Therapy: Psycho-education Group  Summary of Progress: Pt. Participated in discussion about recognizing triggers for depression and anxiety (i.e., hungry, angry, lonely, tired).  [facility]

## 2013-08-02 ENCOUNTER — Other Ambulatory Visit (HOSPITAL_COMMUNITY): Payer: PRIVATE HEALTH INSURANCE | Admitting: Psychiatry

## 2013-08-02 ENCOUNTER — Encounter (HOSPITAL_COMMUNITY): Payer: Self-pay | Admitting: Psychiatry

## 2013-08-02 DIAGNOSIS — F332 Major depressive disorder, recurrent severe without psychotic features: Secondary | ICD-10-CM | POA: Diagnosis not present

## 2013-08-02 DIAGNOSIS — F331 Major depressive disorder, recurrent, moderate: Secondary | ICD-10-CM

## 2013-08-02 NOTE — Progress Notes (Signed)
    Daily Group Progress Note  Program: IOP  Group Time: 9:00-10:30  Participation Level: Active  Behavioral Response: Appropriate  Type of Therapy:  Group Therapy  Summary of Progress: Pt. Participated in meditation exercise. Pt. Reported that she felt "a little anxiety". Pt. Reports that she is applying for jobs and has accepted that she needs to change her current employment because it is significant stressor in her life. Pt. 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     Group Time: 10:30-12:00  Participation Level:  Active  Behavioral Response: Appropriate  Type of Therapy: Psycho-education Group  Summary of Progress: Pt. Participated in discussion about the nature of depression with video of Prudencio Burly.  Nancie Neas, COUNS

## 2013-08-03 ENCOUNTER — Other Ambulatory Visit (HOSPITAL_COMMUNITY): Payer: PRIVATE HEALTH INSURANCE | Admitting: Psychiatry

## 2013-08-03 DIAGNOSIS — F332 Major depressive disorder, recurrent severe without psychotic features: Secondary | ICD-10-CM | POA: Diagnosis not present

## 2013-08-03 DIAGNOSIS — F331 Major depressive disorder, recurrent, moderate: Secondary | ICD-10-CM

## 2013-08-04 ENCOUNTER — Other Ambulatory Visit (HOSPITAL_COMMUNITY): Payer: PRIVATE HEALTH INSURANCE | Admitting: Psychiatry

## 2013-08-04 ENCOUNTER — Encounter (HOSPITAL_COMMUNITY): Payer: Self-pay | Admitting: Psychiatry

## 2013-08-04 DIAGNOSIS — F332 Major depressive disorder, recurrent severe without psychotic features: Secondary | ICD-10-CM | POA: Diagnosis not present

## 2013-08-04 DIAGNOSIS — F331 Major depressive disorder, recurrent, moderate: Secondary | ICD-10-CM

## 2013-08-04 NOTE — Progress Notes (Signed)
    Daily Group Progress Note  Program: IOP  Group Time: 9:00-10:30  Participation Level: Minimal  Behavioral Response: Appropriate  Type of Therapy:  Group Therapy  Summary of Progress: Pt. Participated in meditation exercise. Pt. Reported that she was having a migraine and did not feel like participating in group process.     Group Time: 10:30-12:00  Participation Level:  Minimal  Behavioral Response: Appropriate  Type of Therapy: Psycho-education Group  Summary of Progress: Pt. Participated in discussion about the tow interactions necessary for feeling positive change with video of Gildardo Cranker, Marveen Reeks, COUNS

## 2013-08-05 ENCOUNTER — Encounter (HOSPITAL_COMMUNITY): Payer: Self-pay | Admitting: Psychiatry

## 2013-08-05 ENCOUNTER — Ambulatory Visit (HOSPITAL_COMMUNITY): Payer: Self-pay

## 2013-08-05 NOTE — Progress Notes (Signed)
    Daily Group Progress Note  Program: IOP  Group Time: 9:00-10:30  Participation Level: Active  Behavioral Response: Appropriate  Type of Therapy:  Group Therapy  Summary of Progress: Pt. Participated in meditation exercise. Pt. Reported that she was "doing good", migraine had subsided. Pt. Reported that she disclosed to friend about her depression treatment which was a success.     Group Time: 10:30-12:00  Participation Level:  Active  Behavioral Response: Appropriate  Type of Therapy: Psycho-education Group  Summary of Progress: Pt. Participated in discussion about self-compassion with Marilynn Latino video.  Nancie Neas, COUNS

## 2013-08-08 ENCOUNTER — Other Ambulatory Visit (HOSPITAL_COMMUNITY): Payer: PRIVATE HEALTH INSURANCE | Admitting: Psychiatry

## 2013-08-09 ENCOUNTER — Other Ambulatory Visit (HOSPITAL_COMMUNITY): Payer: PRIVATE HEALTH INSURANCE | Admitting: Psychiatry

## 2013-08-09 ENCOUNTER — Encounter (HOSPITAL_COMMUNITY): Payer: Self-pay | Admitting: Psychiatry

## 2013-08-09 DIAGNOSIS — F332 Major depressive disorder, recurrent severe without psychotic features: Secondary | ICD-10-CM | POA: Diagnosis not present

## 2013-08-09 DIAGNOSIS — F331 Major depressive disorder, recurrent, moderate: Secondary | ICD-10-CM

## 2013-08-09 MED ORDER — MIRTAZAPINE 15 MG PO TABS: 15.0000 mg | ORAL_TABLET | Freq: Every day | ORAL | Status: DC

## 2013-08-09 NOTE — Patient Instructions (Signed)
Patient completed MH-IOP today.  Will follow up with Dr. Peterson Lombard on Friday 08-12-13 @ 1pm and Dr. Volanda Napoleon on 08-23-13 @ 8:30 a.m.  Encouraged support groups.  RTW on 08-16-13, without any restrictions.

## 2013-08-09 NOTE — Progress Notes (Signed)
  [name] Discharge Summary  Meghan [name] [id number]  Admission date:  07/12/13 Discharge date: 08/09/13  Reason for admission: Depression  Chemical Use History: Patient was drinking alcohol and using cannabis  Family of Origin Issues: The patient was stressed about her job and complaining of depression crying spells and increased use of drinking.  Progress in Program Toward Treatment Goals: The patient was admitted to intensive outpatient program.  She was started on medication.  She was encouraged to participate in group.  Patient responded very well on her medication.  She is sleeping better.  She cut down her drinking and her marijuana use.  She was started on Remeron which patient tolerated very well.  She denies any tremors, shakes or any sedation.  Patient is scheduled to go back to work on the 26th.  She scheduled to see psychiatrist at this office on June 2.  Patient is recommended to see counselor at [facility].  Progress (rationale): Patient is improved from her past symptoms.    [facility] 08/09/2013

## 2013-08-09 NOTE — Progress Notes (Signed)
    Daily Group Progress Note  Program: IOP  Group Time: 9:00-10:30  Participation Level: Active  Behavioral Response: Appropriate  Type of Therapy:  Group Therapy  Summary of Progress: Pt. Participated in meditation exercise. Pt. Shared that she felt "ok" about graduating today. Pt. Reported that she as sleeping better, relationships with children was better and perceived less tension in her home which she attributed to being able to talk about her depression. Pt. Reported that she felt able to face work stress with coping skills.     Group Time: 10:30-12:00  Participation Level:  Active  Behavioral Response: Appropriate  Type of Therapy: Psycho-education Group  Summary of Progress: Pt. Participated in discussion about transforming beliefs about stressful situations; watched [employer] video.  Nancie Neas, COUNS

## 2013-08-09 NOTE — Progress Notes (Signed)
Meghan [name] is a 38 y.o. single, African American female, who was referred per her insurance company ([employer]), treatment for ongoing depressive and anxiety symptoms. C/O poor sleep (4-5 restless hrs), racing thoughts, tearfulness, poor concentration, irritability, decreased appetite, isolation, anhedonia, indecisiveness, feelings of worthlessness, helplessness, and hopelessness, panic attacks (one daily), and passive SI (denied a plan or intent). Stated her kids are her deterrent. Admitted to one previous suicide attempt 07/11/2004) OD after the death of her father. Was hospitalized at [facility] during that time. Denied HI or A/V hallucinations. Reported having above symptoms for ~ eight months. Sx's worsened within the past two months. Triggers/Stressors: 1) Job ([employer]) of three years. States there has been a lot of changes. "My schedule has been changed ~ four times within six-eight months." Pt is currently working 10:30 a.m - 7:30 p.m, but states her schedule will change again in May 2015. 2) Medical: Anemia. In January 2015, pt had a blood transfusion and in February 2015, pt had an iron transfusion. Most recent hemoglobin: 7. Stated that her FMLA/Disability Claim was denied. Also, claim was denied whenever she was out of work on 06-09-13 and 06-10-13 due to 64 yr old daughter being pulled out of school d/t illness (reoccurring stomach virus). 3) Strained finances 4) Unresolved grief/loss issues: Recently boyfriend of one year ended the relationship. "He called and said that he isn't ready for marriage, but I was just talking about it."  Pt completed MH-IOP today.  Denies SI/HI or A/V hallucinations.  Reports that she is feeling much better, but continues to struggle with poor appetite and sleep.  States due to the groups, she is able to communicate better.  "I can express my feelings and open up now."  Pt states she would like to continue working on this and living in the moment.  Reports decrease in use of THC  and ETOH.  Denies intake of caffeine after 3 pm.  "I even eat spinach salad now." Pt completed all discharge information.  Scored 8 on the burns.  A:  D/C today.  Will follow up with Dr. Peterson Lombard on 08-12-13 @ 1pm and Dr. Volanda Napoleon on 08-23-13 @ 8:30 a.m.  Encouraged support groups.  RTW on 08-16-13, without any restrictions.  R:  Pt receptive.

## 2013-08-12 ENCOUNTER — Other Ambulatory Visit (HOSPITAL_COMMUNITY): Payer: PRIVATE HEALTH INSURANCE

## 2013-08-23 ENCOUNTER — Encounter (HOSPITAL_COMMUNITY): Payer: Self-pay | Admitting: Psychiatry

## 2013-08-23 ENCOUNTER — Ambulatory Visit (INDEPENDENT_AMBULATORY_CARE_PROVIDER_SITE_OTHER): Payer: PRIVATE HEALTH INSURANCE | Admitting: Psychiatry

## 2013-08-23 VITALS — BP 118/68 | HR 72 | Ht 66.0 in | Wt 202.0 lb

## 2013-08-23 DIAGNOSIS — F1210 Cannabis abuse, uncomplicated: Secondary | ICD-10-CM | POA: Diagnosis not present

## 2013-08-23 DIAGNOSIS — F410 Panic disorder [episodic paroxysmal anxiety] without agoraphobia: Secondary | ICD-10-CM | POA: Diagnosis not present

## 2013-08-23 DIAGNOSIS — F329 Major depressive disorder, single episode, unspecified: Secondary | ICD-10-CM | POA: Diagnosis not present

## 2013-08-23 DIAGNOSIS — F411 Generalized anxiety disorder: Secondary | ICD-10-CM

## 2013-08-23 DIAGNOSIS — F331 Major depressive disorder, recurrent, moderate: Secondary | ICD-10-CM

## 2013-08-23 LAB — COMPLETE METABOLIC PANEL WITH GFR
ALT: 8 U/L (ref 0–35)
AST: 15 U/L (ref 0–37)
Albumin: 4 g/dL (ref 3.5–5.2)
Alkaline Phosphatase: 58 U/L (ref 39–117)
BUN: 7 mg/dL (ref 6–23)
CALCIUM: 9.2 mg/dL (ref 8.4–10.5)
CHLORIDE: 104 meq/L (ref 96–112)
CO2: 28 mEq/L (ref 19–32)
Creat: 0.64 mg/dL (ref 0.50–1.10)
GFR, Est African American: >89 mL/min
GFR, Est Non African American: >89 mL/min
GLUCOSE: 122 mg/dL — AB (ref 70–99)
POTASSIUM: 4.1 meq/L (ref 3.5–5.3)
Sodium: 141 mEq/L (ref 135–145)
Total Bilirubin: 0.4 mg/dL (ref 0.2–1.2)
Total Protein: 7.2 g/dL (ref 6.0–8.3)

## 2013-08-23 LAB — GAMMA GT: GGT: 10 U/L (ref 7–51)

## 2013-08-23 MED ORDER — MIRTAZAPINE 30 MG PO TABS: 30.0000 mg | ORAL_TABLET | Freq: Every day | ORAL | Status: DC
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
## 2013-08-24 LAB — DRUG SCREEN, URINE
Amphetamine Screen, Ur: NEGATIVE
Barbiturate Quant, Ur: NEGATIVE
Benzodiazepines.: NEGATIVE
Cocaine Metabolites: NEGATIVE
Creatinine,U: 160.05 mg/dL
Marijuana Metabolite: POSITIVE — AB
Methadone: NEGATIVE
Opiates: NEGATIVE
PHENCYCLIDINE (PCP): NEGATIVE
PROPOXYPHENE: NEGATIVE

## 2013-08-24 LAB — T4, FREE: FREE T4: 0.92 ng/dL (ref 0.80–1.80)

## 2013-08-24 LAB — URINALYSIS
Bilirubin Urine: NEGATIVE
Glucose, UA: NEGATIVE mg/dL
Hgb urine dipstick: NEGATIVE
KETONES UR: NEGATIVE mg/dL
Leukocytes, UA: NEGATIVE
Nitrite: NEGATIVE
PH: 7 (ref 5.0–8.0)
Protein, ur: NEGATIVE mg/dL
Specific Gravity, Urine: 1.018 (ref 1.005–1.030)
UROBILINOGEN UA: 1 mg/dL (ref 0.0–1.0)

## 2013-08-24 LAB — T3: T3, Total: 109.1 ng/dL (ref 80.0–204.0)

## 2013-08-24 LAB — PREGNANCY, URINE: Preg Test, Ur: NEGATIVE

## 2013-08-24 LAB — TSH: TSH: 1.096 u[IU]/mL (ref 0.350–4.500)

## 2013-08-24 LAB — FOLATE: FOLATE: 10.5 ng/mL

## 2013-08-30 ENCOUNTER — Telehealth (HOSPITAL_COMMUNITY): Payer: Self-pay | Admitting: Psychiatry

## 2013-08-30 NOTE — Telephone Encounter (Signed)
Called and spoke with pt who has given [place] verbal permission to reveal her marijuana use on the short term disability paperwork only. She is aware that the question on the paperwork about substance abuse will be answered yes and that a copy of her UDS which is positive for cannabis and my note which documents her cannabis abuse will be sent as they requested.   Today she states the Remeron is making her very tired. She recalls that at lower doses it also made her tired but she adjusted after 2-3 weeks. She would like to extend FMLA to 3 weeks to give her time to adjust to the medication.   I will make the return to work date on September 13, 2013 and we will fax over the requested short term disability paperwork today as discussed with the pt who has given permission.

## 2013-09-28 ENCOUNTER — Other Ambulatory Visit: Payer: 59

## 2013-10-25 ENCOUNTER — Ambulatory Visit (INDEPENDENT_AMBULATORY_CARE_PROVIDER_SITE_OTHER): Payer: 59 | Admitting: Psychiatry

## 2013-10-25 ENCOUNTER — Encounter (HOSPITAL_COMMUNITY): Payer: Self-pay | Admitting: Psychiatry

## 2013-10-25 VITALS — BP 139/86 | HR 59 | Ht 66.0 in | Wt 209.0 lb

## 2013-10-25 DIAGNOSIS — F331 Major depressive disorder, recurrent, moderate: Secondary | ICD-10-CM

## 2013-10-25 MED ORDER — MIRTAZAPINE 45 MG PO TABS: 45.0000 mg | ORAL_TABLET | Freq: Every day | ORAL | Status: DC
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
## 2013-11-03 ENCOUNTER — Encounter (HOSPITAL_COMMUNITY): Payer: Self-pay | Admitting: Emergency Medicine

## 2013-11-03 ENCOUNTER — Emergency Department (HOSPITAL_COMMUNITY)
Admission: EM | Admit: 2013-11-03 | Discharge: 2013-11-03 | Disposition: A | Discharge status: Home / Self Care | Payer: 59 | Attending: Emergency Medicine | Admitting: Emergency Medicine

## 2013-11-03 DIAGNOSIS — D649 Anemia, unspecified: Secondary | ICD-10-CM

## 2013-11-03 DIAGNOSIS — N898 Other specified noninflammatory disorders of vagina: Secondary | ICD-10-CM | POA: Diagnosis not present

## 2013-11-03 DIAGNOSIS — Z86018 Personal history of other benign neoplasm: Secondary | ICD-10-CM

## 2013-11-03 DIAGNOSIS — M545 Low back pain, unspecified: Secondary | ICD-10-CM | POA: Insufficient documentation

## 2013-11-03 DIAGNOSIS — Z3202 Encounter for pregnancy test, result negative: Secondary | ICD-10-CM | POA: Diagnosis not present

## 2013-11-03 DIAGNOSIS — Z8659 Personal history of other mental and behavioral disorders: Secondary | ICD-10-CM | POA: Diagnosis not present

## 2013-11-03 DIAGNOSIS — N939 Abnormal uterine and vaginal bleeding, unspecified: Secondary | ICD-10-CM

## 2013-11-03 DIAGNOSIS — R110 Nausea: Secondary | ICD-10-CM | POA: Diagnosis not present

## 2013-11-03 DIAGNOSIS — R1030 Lower abdominal pain, unspecified: Secondary | ICD-10-CM

## 2013-11-03 DIAGNOSIS — N949 Unspecified condition associated with female genital organs and menstrual cycle: Secondary | ICD-10-CM | POA: Diagnosis not present

## 2013-11-03 LAB — CBC WITH DIFFERENTIAL/PLATELET
Basophils Absolute: 0 10*3/uL (ref 0.0–0.1)
Basophils Relative: 0 % (ref 0–1)
Eosinophils Absolute: 0.1 10*3/uL (ref 0.0–0.7)
Eosinophils Relative: 1 % (ref 0–5)
HCT: 33.5 % — ABNORMAL LOW (ref 36.0–46.0)
Hemoglobin: 9.9 g/dL — ABNORMAL LOW (ref 12.0–15.0)
Lymphocytes Relative: 13 % (ref 12–46)
Lymphs Abs: 0.8 10*3/uL (ref 0.7–4.0)
MCH: 22.7 pg — ABNORMAL LOW (ref 26.0–34.0)
MCHC: 29.6 g/dL — ABNORMAL LOW (ref 30.0–36.0)
MCV: 76.7 fL — ABNORMAL LOW (ref 78.0–100.0)
Monocytes Absolute: 0.5 10*3/uL (ref 0.1–1.0)
Monocytes Relative: 8 % (ref 3–12)
Neutro Abs: 4.7 10*3/uL (ref 1.7–7.7)
Neutrophils Relative %: 78 % — ABNORMAL HIGH (ref 43–77)
Platelets: 350 10*3/uL (ref 150–400)
RBC: 4.37 MIL/uL (ref 3.87–5.11)
RDW: 17.8 % — ABNORMAL HIGH (ref 11.5–15.5)
WBC: 6 10*3/uL (ref 4.0–10.5)

## 2013-11-03 LAB — BASIC METABOLIC PANEL
Anion gap: 12 (ref 5–15)
BUN: 11 mg/dL (ref 6–23)
CO2: 25 mEq/L (ref 19–32)
Calcium: 8.9 mg/dL (ref 8.4–10.5)
Chloride: 104 mEq/L (ref 96–112)
Creatinine, Ser: 0.64 mg/dL (ref 0.50–1.10)
GFR calc Af Amer: >90 mL/min (ref >90)
GFR calc non Af Amer: >90 mL/min (ref >90)
Glucose, Bld: 91 mg/dL (ref 70–99)
Potassium: 4.6 mEq/L (ref 3.7–5.3)
Sodium: 141 mEq/L (ref 137–147)

## 2013-11-03 LAB — URINALYSIS, ROUTINE W REFLEX MICROSCOPIC
BILIRUBIN URINE: NEGATIVE
Glucose, UA: NEGATIVE mg/dL
Ketones, ur: NEGATIVE mg/dL
NITRITE: NEGATIVE
PH: 8.5 — AB (ref 5.0–8.0)
PROTEIN: NEGATIVE mg/dL
Specific Gravity, Urine: 1.021 (ref 1.005–1.030)
UROBILINOGEN UA: 0.2 mg/dL (ref 0.0–1.0)

## 2013-11-03 LAB — URINE MICROSCOPIC-ADD ON

## 2013-11-03 LAB — WET PREP, GENITAL
Trich, Wet Prep: NONE SEEN
Yeast Wet Prep HPF POC: NONE SEEN

## 2013-11-03 LAB — POC URINE PREG, ED: Preg Test, Ur: NEGATIVE

## 2013-11-03 MED ORDER — NAPROXEN 500 MG PO TABS: 500.0000 mg | ORAL_TABLET | Freq: Two times a day (BID) | ORAL | Status: DC

## 2013-11-03 MED ORDER — MORPHINE SULFATE 4 MG/ML IJ SOLN
4.0000 mg | Freq: Once | INTRAMUSCULAR | Status: AC
  Administered 2013-11-03: 4 mg via INTRAVENOUS
  Filled 2013-11-03: qty 1

## 2013-11-03 NOTE — ED Notes (Addendum)
1725: Upon discharge pt stated that she was driving herself home. I informed the patient that she could not drive because she received morphine at 1615. Pt persisted, informed ED charge RN, whom also agreed that she could not drive. Pt informed and is searching for a ride home.   1727: Pt states that she will have her child's father pick her up.

## 2013-11-03 NOTE — ED Provider Notes (Signed)
CSN: [id number]     Arrival date & time 11/03/13  1427 History   First MD Initiated Contact with Patient 11/03/13 1520     Chief Complaint  Patient presents with  . Abdominal Pain  . Vaginal Bleeding  . Back Pain     (Consider location/radiation/quality/duration/timing/severity/associated sxs/prior Treatment) HPI Pt is a 38yo female with hx of chronic anemia presenting to ED with c/o lower abdominal pain associated with vaginal bleeding that started earlier today.  Pt states her last regular menstrual cycle just ended (July 29th - Aug 5th) states this morning she started having cramping, pressure like lower abdominal pain, 7/10, radiating to her back.  She noticed yellow discharge this morning but then she noticed about 1 hour later she was having vaginal bleeding "enough to ruin my underpants"  Reports associated nausea but states that has resolved.  Pt does have a OB/GYN and states she had an ultrasound of her fibroid 2 months ago, states "everything was fine."  States she is not on birth control as she had her tubes tied.  States she is not concerned for STDs, states she was tested 6 months ago.  Denies having hx of irregular menstrual cycles. Abdominal surgical hx significant for c-section x3 and dilation and curettage of uterus.  Denies fever vomiting or diarrhea. Denies dysuria, hematuria, or frequency but does report pressure feeling in her pelvis.  Denies taking any pain medication PTA. Denies calling her OB/GYN prior to coming to ED for evaluation.   Past Medical History  Diagnosis Date  . Anemia   . Pancytopenia 04/22/2013  . Iron deficiency anemia, unspecified 04/27/2013  . Anxiety   . Depression    Past Surgical History  Procedure Laterality Date  . Dilation and curettage of uterus    . Cesarean section      x3   Family History  Problem Relation Age of Onset  . Cancer Maternal Grandmother     leukemia  . Alcohol abuse Father   . Drug abuse Sister   . Depression Sister   .  Drug abuse Brother    History  Substance Use Topics  . Smoking status: Never Smoker   . Smokeless tobacco: Never Used  . Alcohol Use: 1.8 - 3.0 oz/week    3-5 Shots of liquor per week     Comment: occ on weekends socially   OB History   Grav Para Term Preterm Abortions TAB SAB Ect Mult Living                 Review of Systems  Constitutional: Negative for fever and chills.  Gastrointestinal: Positive for nausea and abdominal pain. Negative for vomiting, diarrhea, constipation and blood in stool.  Genitourinary: Positive for vaginal bleeding, vaginal discharge, menstrual problem ( irregular? just ended on Aug 5th.) and pelvic pain. Negative for dysuria, urgency, frequency, hematuria, flank pain, decreased urine volume and vaginal pain.  Musculoskeletal: Positive for back pain ( lower). Negative for myalgias.  All other systems reviewed and are negative.     Allergies  Review of patient's allergies indicates no known allergies.  Home Medications   Prior to Admission medications   Medication Sig Start Date End Date Taking? Authorizing Provider  mirtazapine (REMERON) 45 MG tablet Take 1 tablet (45 mg total) by mouth at bedtime. 10/25/13 10/25/14 Yes Charlcie Cradle, MD  naproxen (NAPROSYN) 500 MG tablet Take 1 tablet (500 mg total) by mouth 2 (two) times daily. 11/03/13   Noland Fordyce, PA-C   BP  128/73  Pulse 65  Temp(Src) 97.9 F (36.6 C) (Oral)  Resp 16  SpO2 99%  LMP 10/19/2013 Physical Exam  Nursing note and vitals reviewed. Constitutional: She appears well-developed and well-nourished. No distress.  Pt lying comfortably in exam bed, NAD.   HENT:  Head: Normocephalic and atraumatic.  Eyes: Conjunctivae are normal. No scleral icterus.  Neck: Normal range of motion.  Cardiovascular: Normal rate, regular rhythm and normal heart sounds.   Pulmonary/Chest: Effort normal and breath sounds normal. No respiratory distress. She has no wheezes. She has no rales. She exhibits no  tenderness.  Abdominal: Soft. Bowel sounds are normal. She exhibits no distension and no mass. There is tenderness. There is no rebound and no guarding.  Soft, non-distended. Tenderness in lower abdomen, worse in pelvic region. No rebound, guarding or masses. No CVAT  Genitourinary:  Chaperoned exam. Normal external genitalia. Small to moderate amount of red blood in vaginal canal. No vaginal discharge. No CMT, adnexal tenderness or masses.   Musculoskeletal: Normal range of motion. She exhibits no edema.  Neurological: She is alert.  Skin: Skin is warm and dry. She is not diaphoretic.    ED Course  Procedures (including critical care time) Labs Review Labs Reviewed  WET PREP, GENITAL - Abnormal; Notable for the following:    Clue Cells Wet Prep HPF POC RARE (*)    WBC, Wet Prep HPF POC RARE (*)    All other components within normal limits  URINALYSIS, ROUTINE W REFLEX MICROSCOPIC - Abnormal; Notable for the following:    pH 8.5 (*)    Hgb urine dipstick LARGE (*)    Leukocytes, UA TRACE (*)    All other components within normal limits  CBC WITH DIFFERENTIAL - Abnormal; Notable for the following:    Hemoglobin 9.9 (*)    HCT 33.5 (*)    MCV 76.7 (*)    MCH 22.7 (*)    MCHC 29.6 (*)    RDW 17.8 (*)    Neutrophils Relative % 78 (*)    All other components within normal limits  GC/CHLAMYDIA PROBE AMP  BASIC METABOLIC PANEL  URINE MICROSCOPIC-ADD ON  POC URINE PREG, ED    Imaging Review No results found.   EKG Interpretation None      MDM   Final diagnoses:  Vaginal bleeding  Lower abdominal pain  History of uterine fibroid  Anemia, unspecified anemia type    Pt is a 38yo female with hx of anemia and uterine fibroids presenting to ED with c/o lower abdominal pain, yellow vaginal discharge and vaginal bleeding that started this morning. Reports "normal" ultrasound of fibroid 22months ago. Reports also having her tubes tied, low concern for STD.  Will get CBC, BMP, UA  (due to reports of pelvic pressure and tenderness) urine preg, GC/chlamydia and wet prep.  Hgb/Hct expected to be low due to chronic anemia, will ensure not too low.  If labs otherwise unremarkable pt may be discharged home to f/u with PCP and OB/GYN   CBC: Hgb-9.9, consistent with previous labs Wet prep: unremarkable   Do not believe pelvic U/S needed at this time as pt is not pregnant and hemodynamically stable. Will discharge pt home with naproxen to follow up with OB/GYN at [facility] outpatient clinic as well as PCP for further evaluation of vaginal bleeding. Return precautions provided. Pt verbalized understanding and agreement with tx plan.  Discussed pt with Dr. Christy Gentles who agrees with tx plan.   Noland Fordyce, PA-C 11/04/13 [phone]

## 2013-11-03 NOTE — ED Notes (Signed)
Pt states that this morning, she started having yellow discharge and low abd pain "through to her back".  States that about an hour later she started having bright red vaginal bleeding.  States that she has already had her period this month (july 29-aug 5).

## 2013-11-04 LAB — GC/CHLAMYDIA PROBE AMP
CT Probe RNA: POSITIVE — AB
GC Probe RNA: NEGATIVE

## 2013-11-05 ENCOUNTER — Telehealth (HOSPITAL_BASED_OUTPATIENT_CLINIC_OR_DEPARTMENT_OTHER): Payer: Self-pay | Admitting: Emergency Medicine

## 2013-11-05 NOTE — Telephone Encounter (Signed)
+  Chlamydia. Chart sent to [place] office for review. [facility] faxed.

## 2013-11-06 NOTE — ED Provider Notes (Signed)
Medical screening examination/treatment/procedure(s) were performed by non-physician practitioner and as supervising physician I was immediately available for consultation/collaboration.   EKG Interpretation None        Sharyon Cable, MD 11/06/13 2037

## 2013-11-08 ENCOUNTER — Telehealth (HOSPITAL_BASED_OUTPATIENT_CLINIC_OR_DEPARTMENT_OTHER): Payer: Self-pay | Admitting: Emergency Medicine

## 2013-11-08 NOTE — Telephone Encounter (Signed)
Post ED Visit - Positive Culture Follow-up: Successful Patient Follow-Up  Culture assessed and recommendations reviewed by: []  Wes Dulaney, Pharm.D., BCPS []  Heide Guile, Pharm.D., BCPS []  Alycia Rossetti, Pharm.D., BCPS []  [place], [place].D., BCPS, AAHIVP []  Legrand Como, Pharm.D., BCPS, AAHIVP []  Hassie Bruce, Pharm.D. []  Cassie Nicole Kindred, Pharm.D.  Positive chlamydia culture  []  Patient discharged without antimicrobial prescription and treatment is now indicated []  Organism is resistant to prescribed ED discharge antimicrobial []  Patient with positive blood cultures  Changes discussed with ED provider: Dr. Olen Pel New antibiotic prescription Doxycycline 100mg  po bid x 7 days Called to   [facility] patient, date   , time    Hazle Nordmann 11/08/2013, 4:01 PM

## 2013-11-12 ENCOUNTER — Telehealth (HOSPITAL_BASED_OUTPATIENT_CLINIC_OR_DEPARTMENT_OTHER): Payer: Self-pay | Admitting: Emergency Medicine

## 2013-11-12 NOTE — Telephone Encounter (Signed)
ID verified. pt notified of + Chlamydia and need for treatment. STD instructions provided - patient verbalized understanding. RX called [facility] [phone]

## 2013-11-19 ENCOUNTER — Other Ambulatory Visit (HOSPITAL_COMMUNITY): Payer: Self-pay | Admitting: Psychiatry

## 2013-12-27 ENCOUNTER — Encounter (HOSPITAL_COMMUNITY): Payer: Self-pay | Admitting: Psychiatry

## 2013-12-27 ENCOUNTER — Ambulatory Visit (INDEPENDENT_AMBULATORY_CARE_PROVIDER_SITE_OTHER): Payer: 59 | Admitting: Psychiatry

## 2013-12-27 DIAGNOSIS — F331 Major depressive disorder, recurrent, moderate: Secondary | ICD-10-CM | POA: Diagnosis not present

## 2013-12-27 DIAGNOSIS — F411 Generalized anxiety disorder: Secondary | ICD-10-CM | POA: Diagnosis not present

## 2013-12-27 DIAGNOSIS — F410 Panic disorder [episodic paroxysmal anxiety] without agoraphobia: Secondary | ICD-10-CM | POA: Insufficient documentation

## 2013-12-27 DIAGNOSIS — F1210 Cannabis abuse, uncomplicated: Secondary | ICD-10-CM | POA: Diagnosis not present

## 2013-12-27 MED ORDER — MIRTAZAPINE 45 MG PO TABS: 45.0000 mg | ORAL_TABLET | Freq: Every day | ORAL | Status: DC
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
## 2014-02-01 ENCOUNTER — Telehealth: Payer: Self-pay | Admitting: Hematology and Oncology

## 2014-02-01 ENCOUNTER — Ambulatory Visit (HOSPITAL_BASED_OUTPATIENT_CLINIC_OR_DEPARTMENT_OTHER): Payer: 59 | Admitting: Hematology and Oncology

## 2014-02-01 ENCOUNTER — Other Ambulatory Visit (HOSPITAL_BASED_OUTPATIENT_CLINIC_OR_DEPARTMENT_OTHER): Payer: 59

## 2014-02-01 ENCOUNTER — Encounter: Payer: Self-pay | Admitting: Hematology and Oncology

## 2014-02-01 VITALS — BP 137/67 | HR 56 | Temp 98.2°F | Resp 18 | Ht 66.0 in | Wt 207.5 lb

## 2014-02-01 DIAGNOSIS — N921 Excessive and frequent menstruation with irregular cycle: Secondary | ICD-10-CM

## 2014-02-01 DIAGNOSIS — D509 Iron deficiency anemia, unspecified: Secondary | ICD-10-CM

## 2014-02-01 DIAGNOSIS — D72819 Decreased white blood cell count, unspecified: Secondary | ICD-10-CM

## 2014-02-01 DIAGNOSIS — Z23 Encounter for immunization: Secondary | ICD-10-CM

## 2014-02-01 DIAGNOSIS — N920 Excessive and frequent menstruation with regular cycle: Secondary | ICD-10-CM

## 2014-02-01 LAB — CBC & DIFF AND RETIC
BASO%: 1.4 % (ref 0.0–2.0)
BASOS ABS: 0 10*3/uL (ref 0.0–0.1)
EOS%: 2.4 % (ref 0.0–7.0)
Eosinophils Absolute: 0.1 10*3/uL (ref 0.0–0.5)
HCT: 30.6 % — ABNORMAL LOW (ref 34.8–46.6)
HGB: 8.8 g/dL — ABNORMAL LOW (ref 11.6–15.9)
Immature Retic Fract: 14.3 % — ABNORMAL HIGH (ref 1.60–10.00)
LYMPH%: 21.9 % (ref 14.0–49.7)
MCH: 20.2 pg — AB (ref 25.1–34.0)
MCHC: 28.9 g/dL — AB (ref 31.5–36.0)
MCV: 69.8 fL — AB (ref 79.5–101.0)
MONO#: 0.3 10*3/uL (ref 0.1–0.9)
MONO%: 14.9 % — AB (ref 0.0–14.0)
NEUT#: 1.3 10*3/uL — ABNORMAL LOW (ref 1.5–6.5)
NEUT%: 59.4 % (ref 38.4–76.8)
Platelets: 157 10*3/uL (ref 145–400)
RBC: 4.38 10*6/uL (ref 3.70–5.45)
RDW: 24 % — AB (ref 11.2–14.5)
RETIC %: 1.67 % (ref 0.70–2.10)
RETIC CT ABS: 73.15 10*3/uL (ref 33.70–90.70)
WBC: 2.3 10*3/uL — ABNORMAL LOW (ref 3.9–10.3)
lymph#: 0.5 10*3/uL — ABNORMAL LOW (ref 0.9–3.3)

## 2014-02-01 LAB — FERRITIN CHCC: Ferritin: <4 ng/ml — ABNORMAL LOW (ref 9–269)

## 2014-02-01 MED ORDER — INFLUENZA VAC SPLIT QUAD 0.5 ML IM SUSY
0.5000 mL | PREFILLED_SYRINGE | Freq: Once | INTRAMUSCULAR | Status: AC
  Administered 2014-02-01: 0.5 mL via INTRAMUSCULAR
  Filled 2014-02-01: qty 0.5

## 2014-02-01 NOTE — Telephone Encounter (Signed)
gv adn printed appt sched and avs fo rpt for NOV, Dec and May ...sed added tx.

## 2014-02-01 NOTE — Assessment & Plan Note (Signed)
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 The patient denies recent history of fevers, cough, chills, diarrhea or dysuria. She is asymptomatic from the leukopenia. I will observe for now.

## 2014-02-01 NOTE — Assessment & Plan Note (Signed)
The most likely cause of her anemia is due to chronic blood loss from menorrhagia. We discussed some of the risks, benefits, and alternatives of intravenous iron infusions. The patient is symptomatic from anemia and the iron level is critically low. She tolerated oral iron supplement poorly and desires to achieved higher levels of iron faster for adequate hematopoesis. Some of the side-effects to be expected including risks of infusion reactions, phlebitis, headaches, nausea and fatigue.  The patient is willing to proceed. Patient education material was dispensed.  Goal is to keep ferritin level greater than 50. I also recommend discontinuation of naprosyn use

## 2014-02-01 NOTE — Progress Notes (Signed)
[place] OFFICE PROGRESS NOTE  CALLAHAN, SIDNEY, DO SUMMARY OF HEMATOLOGIC HISTORY: This is a pleasant lady with severe abnormal CBC. She was noted to have severe iron deficiency anemia since 2014, with persistent anemia. She was referred here in January 2015 further workup. On 04/22/2013 she received one unit of blood transfusion for severe symptomatic anemia. On 04/27/2013, she received one dose of iron feraheme with resolution of iron deficiency anemia. INTERVAL HISTORY: Meghan [name] 38 y.o. female returns for further follow-up. She complained of severe fatigue and had syncopal episodes 2 recently. She has excessive pica. She complain of excessive menorrhagia with a regular cycle. The patient denies any recent signs or symptoms of bleeding such as spontaneous epistaxis, hematuria or hematochezia.  I have reviewed the past medical history, past surgical history, social history and family history with the patient and they are unchanged from previous note.  ALLERGIES:  has No Known Allergies.  MEDICATIONS:  Current Outpatient Prescriptions  Medication Sig Dispense Refill  . mirtazapine (REMERON) 45 MG tablet Take 1 tablet (45 mg total) by mouth at bedtime. 30 tablet 1  . naproxen (NAPROSYN) 500 MG tablet Take 1 tablet (500 mg total) by mouth 2 (two) times daily. 30 tablet 0   No current facility-administered medications for this visit.     REVIEW OF SYSTEMS:   Constitutional: Denies fevers, chills or night sweats Eyes: Denies blurriness of vision Ears, nose, mouth, throat, and face: Denies mucositis or sore throat Respiratory: Denies cough, dyspnea or wheezes Cardiovascular: Denies palpitation, chest discomfort or lower extremity swelling Gastrointestinal:  Denies nausea, heartburn or change in bowel habits Skin: Denies abnormal skin rashes Lymphatics: Denies new lymphadenopathy or easy bruising Neurological:Denies numbness, tingling or new  weaknesses Behavioral/Psych: Mood is stable, no new changes  All other systems were reviewed with the patient and are negative.  PHYSICAL EXAMINATION: ECOG PERFORMANCE STATUS: 1 - Symptomatic but completely ambulatory  Filed Vitals:   02/01/14 1011  BP: 137/67  Pulse: 56  Temp: 98.2 F (36.8 C)  Resp: 18   Filed Weights   02/01/14 1011  Weight: 207 lb 8 oz (94.121 kg)    GENERAL:alert, no distress and comfortable SKIN: skin color, texture, turgor are normal, no rashes or significant lesions EYES: normal, Conjunctiva are Pale and non-injected, sclera clear OROPHARYNX:no exudate, no erythema and lips, buccal mucosa, and tongue normal  NECK: supple, thyroid normal size, non-tender, without nodularity LYMPH:  no palpable lymphadenopathy in the cervical, axillary or inguinal LUNGS: clear to auscultation and percussion with normal breathing effort HEART: regular rate & rhythm and no murmurs and no lower extremity edema ABDOMEN:abdomen soft, non-tender and normal bowel sounds Musculoskeletal:no cyanosis of digits and no clubbing  NEURO: alert & oriented x 3 with fluent speech, no focal motor/sensory deficits  LABORATORY DATA:  I have reviewed the data as listed Results for orders placed or performed in visit on 02/01/14 (from the past 48 hour(s))  CBC & Diff and Retic     Status: Abnormal   Collection Time: 02/01/14  9:41 AM  Result Value Ref Range   WBC 2.3 (L) 3.9 - 10.3 10e3/uL   NEUT# 1.3 (L) 1.5 - 6.5 10e3/uL   HGB 8.8 (L) 11.6 - 15.9 g/dL   HCT 30.6 (L) 34.8 - 46.6 %   Platelets 157 145 - 400 10e3/uL   MCV 69.8 (L) 79.5 - 101.0 fL   MCH 20.2 (L) 25.1 - 34.0 pg   MCHC 28.9 (L) 31.5 - 36.0  g/dL   RBC 4.38 3.70 - 5.45 10e6/uL   RDW 24.0 (H) 11.2 - 14.5 %   lymph# 0.5 (L) 0.9 - 3.3 10e3/uL   MONO# 0.3 0.1 - 0.9 10e3/uL   Eosinophils Absolute 0.1 0.0 - 0.5 10e3/uL   Basophils Absolute 0.0 0.0 - 0.1 10e3/uL   NEUT% 59.4 38.4 - 76.8 %   LYMPH% 21.9 14.0 - 49.7 %   MONO%  14.9 (H) 0.0 - 14.0 %   EOS% 2.4 0.0 - 7.0 %   BASO% 1.4 0.0 - 2.0 %   Retic % 1.67 0.70 - 2.10 %   Retic Ct Abs 73.15 33.70 - 90.70 10e3/uL   Immature Retic Fract 14.30 (H) 1.60 - 10.00 %  Ferritin     Status: Abnormal   Collection Time: 02/01/14  9:41 AM  Result Value Ref Range   Ferritin <4 (L) 9 - 269 ng/ml    Lab Results  Component Value Date   WBC 2.3* 02/01/2014   HGB 8.8* 02/01/2014   HCT 30.6* 02/01/2014   MCV 69.8* 02/01/2014   PLT 157 02/01/2014   ASSESSMENT & PLAN:  Iron deficiency anemia The most likely cause of her anemia is due to chronic blood loss from menorrhagia. We discussed some of the risks, benefits, and alternatives of intravenous iron infusions. The patient is symptomatic from anemia and the iron level is critically low. She tolerated oral iron supplement poorly and desires to achieved higher levels of iron faster for adequate hematopoesis. Some of the side-effects to be expected including risks of infusion reactions, phlebitis, headaches, nausea and fatigue.  The patient is willing to proceed. Patient education material was dispensed.  Goal is to keep ferritin level greater than 50. 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 The patient denies recent history of fevers, cough, chills, diarrhea or dysuria. She is asymptomatic from the leukopenia. I will observe for now.       All questions were answered. The patient knows to call the clinic with any problems, questions or concerns. No barriers to learning was detected.  I spent 30 minutes counseling the patient face to face. The total time spent in the appointment was 40 minutes and more than 50% was on counseling.     [place], [place], MD 02/01/2014 11:55 AM

## 2014-02-01 NOTE — Assessment & Plan Note (Signed)
I recommend she sees PCP or gynecologist for further evaluation and treatment for this is causing severe iron deficiency anemia

## 2014-02-02 ENCOUNTER — Telehealth: Payer: Self-pay | Admitting: *Deleted

## 2014-02-02 NOTE — Telephone Encounter (Signed)
Patient called and wanted to have a later appt on 11/16. Appt moved and left her a message

## 2014-02-06 ENCOUNTER — Ambulatory Visit (HOSPITAL_BASED_OUTPATIENT_CLINIC_OR_DEPARTMENT_OTHER): Payer: 59

## 2014-02-06 DIAGNOSIS — D509 Iron deficiency anemia, unspecified: Secondary | ICD-10-CM

## 2014-02-06 MED ORDER — SODIUM CHLORIDE 0.9 % IV SOLN
1020.0000 mg | Freq: Once | INTRAVENOUS | Status: AC
  Administered 2014-02-06: 1020 mg via INTRAVENOUS
  Filled 2014-02-06: qty 34

## 2014-02-06 NOTE — Patient Instructions (Signed)
Ferumoxytol injection What is this medicine? FERUMOXYTOL is an iron complex. Iron is used to make healthy red blood cells, which carry oxygen and nutrients throughout the body. This medicine is used to treat iron deficiency anemia in people with chronic kidney disease. This medicine may be used for other purposes; ask your health care provider or pharmacist if you have questions. COMMON BRAND NAME(S): Feraheme What should I tell my health care provider before I take this medicine? They need to know if you have any of these conditions: -anemia not caused by low iron levels -high levels of iron in the blood -magnetic resonance imaging (MRI) test scheduled -an unusual or allergic reaction to iron, other medicines, foods, dyes, or preservatives -pregnant or trying to get pregnant -breast-feeding How should I use this medicine? This medicine is for injection into a vein. It is given by a health care professional in a hospital or clinic setting. Talk to your pediatrician regarding the use of this medicine in children. Special care may be needed. Overdosage: If you think you've taken too much of this medicine contact a poison control center or emergency room at once. Overdosage: If you think you have taken too much of this medicine contact a poison control center or emergency room at once. NOTE: This medicine is only for you. Do not share this medicine with others. What if I miss a dose? It is important not to miss your dose. Call your doctor or health care professional if you are unable to keep an appointment. What may interact with this medicine? This medicine may interact with the following medications: -other iron products This list may not describe all possible interactions. Give your health care provider a list of all the medicines, herbs, non-prescription drugs, or dietary supplements you use. Also tell them if you smoke, drink alcohol, or use illegal drugs. Some items may interact with your  medicine. What should I watch for while using this medicine? Visit your doctor or healthcare professional regularly. Tell your doctor or healthcare professional if your symptoms do not start to get better or if they get worse. You may need blood work done while you are taking this medicine. You may need to follow a special diet. Talk to your doctor. Foods that contain iron include: whole grains/cereals, dried fruits, beans, or peas, leafy green vegetables, and organ meats (liver, kidney). What side effects may I notice from receiving this medicine? Side effects that you should report to your doctor or health care professional as soon as possible: -allergic reactions like skin rash, itching or hives, swelling of the face, lips, or tongue -breathing problems -changes in blood pressure -feeling faint or lightheaded, falls -fever or chills -flushing, sweating, or hot feelings -swelling of the ankles or feet Side effects that usually do not require medical attention (Report these to your doctor or health care professional if they continue or are bothersome.): -diarrhea -headache -nausea, vomiting -stomach pain This list may not describe all possible side effects. Call your doctor for medical advice about side effects. You may report side effects to FDA at 1-800-FDA-1088. Where should I keep my medicine? This drug is given in a hospital or clinic and will not be stored at home. NOTE: This sheet is a summary. It may not cover all possible information. If you have questions about this medicine, talk to your doctor, pharmacist, or health care provider.  2015, [employer]vier/Gold Standard. (2011-10-24 15:23:36)  Iron Deficiency Anemia Anemia is a condition in which there are less red   blood cells or hemoglobin in the blood than normal. Hemoglobin is the part of red blood cells that carries oxygen. Iron deficiency anemia is anemia caused by too little iron. It is the most common type of anemia. It may leave  you tired and short of breath. CAUSES   Lack of iron in the diet.  Poor absorption of iron, as seen with intestinal disorders.  Intestinal bleeding.  Heavy periods. SIGNS AND SYMPTOMS  Mild anemia may not be noticeable. Symptoms may include:  Fatigue.  Headache.  Pale skin.  Weakness.  Tiredness.  Shortness of breath.  Dizziness.  Cold hands and feet.  Fast or irregular heartbeat. DIAGNOSIS  Diagnosis requires a thorough evaluation and physical exam by your health care provider. Blood tests are generally used to confirm iron deficiency anemia. Additional tests may be done to find the underlying cause of your anemia. These may include:  Testing for blood in the stool (fecal occult blood test).  A procedure to see inside the colon and rectum (colonoscopy).  A procedure to see inside the esophagus and stomach (endoscopy). TREATMENT  Iron deficiency anemia is treated by correcting the cause of the deficiency. Treatment may involve:  Adding iron-rich foods to your diet.  Taking iron supplements. Pregnant or breastfeeding women need to take extra iron because their normal diet usually does not provide the required amount.  Taking vitamins. Vitamin C improves the absorption of iron. Your health care provider may recommend that you take your iron tablets with a glass of orange juice or vitamin C supplement.  Medicines to make heavy menstrual flow lighter.  Surgery. HOME CARE INSTRUCTIONS   Take iron as directed by your health care provider.  If you cannot tolerate taking iron supplements by mouth, talk to your health care provider about taking them through a vein (intravenously) or an injection into a muscle.  For the best iron absorption, iron supplements should be taken on an empty stomach. If you cannot tolerate them on an empty stomach, you may need to take them with food.  Do not drink milk or take antacids at the same time as your iron supplements. Milk and  antacids may interfere with the absorption of iron.  Iron supplements can cause constipation. Make sure to include fiber in your diet to prevent constipation. A stool softener may also be recommended.  Take vitamins as directed by your health care provider.  Eat a diet rich in iron. Foods high in iron include liver, lean beef, whole-grain bread, eggs, dried fruit, and dark green leafy vegetables. SEEK IMMEDIATE MEDICAL CARE IF:   You faint. If this happens, do not drive. Call your local emergency services (911 in [place] if no other help is available.  You have chest pain.  You feel nauseous or vomit.  You have severe or increased shortness of breath with activity.  You feel weak.  You have a rapid heartbeat.  You have unexplained sweating.  You become light-headed when getting up from a chair or bed. MAKE SURE YOU:   Understand these instructions.  Will watch your condition.  Will get help right away if you are not doing well or get worse. Document Released: 03/07/2000 Document Revised: 03/15/2013 Document Reviewed: 11/15/2012 [employer] Patient Information 2015 [employer]. This information is not intended to replace advice given to you by your health care provider. Make sure you discuss any questions you have with your health care provider.   

## 2014-02-28 ENCOUNTER — Ambulatory Visit (HOSPITAL_COMMUNITY): Payer: Self-pay | Admitting: Psychiatry

## 2014-03-02 ENCOUNTER — Telehealth: Payer: Self-pay | Admitting: Hematology and Oncology

## 2014-03-02 NOTE — Telephone Encounter (Signed)
returned pt call and lvm for pt that appt moved to 8am on 12.14.Marland KitchenMarland Kitchenper pt request

## 2014-03-06 ENCOUNTER — Other Ambulatory Visit (HOSPITAL_BASED_OUTPATIENT_CLINIC_OR_DEPARTMENT_OTHER): Payer: 59

## 2014-03-06 ENCOUNTER — Telehealth: Payer: Self-pay | Admitting: *Deleted

## 2014-03-06 ENCOUNTER — Other Ambulatory Visit: Payer: Self-pay

## 2014-03-06 DIAGNOSIS — D509 Iron deficiency anemia, unspecified: Secondary | ICD-10-CM

## 2014-03-06 LAB — CBC & DIFF AND RETIC
BASO%: 0.4 % (ref 0.0–2.0)
Basophils Absolute: 0 10*3/uL (ref 0.0–0.1)
EOS%: 3 % (ref 0.0–7.0)
Eosinophils Absolute: 0.1 10*3/uL (ref 0.0–0.5)
HCT: 39.3 % (ref 34.8–46.6)
HGB: 11.8 g/dL (ref 11.6–15.9)
Immature Retic Fract: 13.7 % — ABNORMAL HIGH (ref 1.60–10.00)
LYMPH%: 22.6 % (ref 14.0–49.7)
MCH: 24.9 pg — ABNORMAL LOW (ref 25.1–34.0)
MCHC: 30 g/dL — ABNORMAL LOW (ref 31.5–36.0)
MCV: 83.1 fL (ref 79.5–101.0)
MONO#: 0.2 10*3/uL (ref 0.1–0.9)
MONO%: 10.4 % (ref 0.0–14.0)
NEUT%: 63.6 % (ref 38.4–76.8)
NEUTROS ABS: 1.5 10*3/uL (ref 1.5–6.5)
Platelets: 134 10*3/uL — ABNORMAL LOW (ref 145–400)
RBC: 4.73 10*6/uL (ref 3.70–5.45)
RDW: 25.9 % — ABNORMAL HIGH (ref 11.2–14.5)
RETIC %: 1.39 % (ref 0.70–2.10)
Retic Ct Abs: 65.75 10*3/uL (ref 33.70–90.70)
WBC: 2.3 10*3/uL — ABNORMAL LOW (ref 3.9–10.3)
lymph#: 0.5 10*3/uL — ABNORMAL LOW (ref 0.9–3.3)

## 2014-03-06 LAB — FERRITIN CHCC: Ferritin: 50 ng/ml (ref 9–269)

## 2014-03-06 NOTE — Telephone Encounter (Signed)
Informed pt of Ferritin level improved at 50 and no need to come back until next appt in May. Pt verbalized understanding.

## 2014-03-06 NOTE — Telephone Encounter (Signed)
-----   Message from Heath Lark, MD sent at 03/06/2014  9:38 AM EST ----- Regarding: labs Labs are better. OK to wait till next appt to see her back in May ----- Message -----    From: [facility] Interface    Sent: 03/06/2014   8:32 AM      To: Heath Lark, MD

## 2014-04-06 ENCOUNTER — Other Ambulatory Visit: Payer: Self-pay | Admitting: Obstetrics & Gynecology

## 2014-04-07 ENCOUNTER — Encounter (HOSPITAL_BASED_OUTPATIENT_CLINIC_OR_DEPARTMENT_OTHER): Payer: Self-pay | Admitting: *Deleted

## 2014-04-07 NOTE — Progress Notes (Signed)
NPO AFTER MN. ARRIVE AT 1460. NEEDS HG.

## 2014-04-10 ENCOUNTER — Ambulatory Visit (HOSPITAL_BASED_OUTPATIENT_CLINIC_OR_DEPARTMENT_OTHER): Payer: 59 | Admitting: Anesthesiology

## 2014-04-10 ENCOUNTER — Encounter (HOSPITAL_BASED_OUTPATIENT_CLINIC_OR_DEPARTMENT_OTHER): Payer: Self-pay | Admitting: Certified Registered"

## 2014-04-10 ENCOUNTER — Ambulatory Visit (HOSPITAL_BASED_OUTPATIENT_CLINIC_OR_DEPARTMENT_OTHER)
Admission: RE | Admit: 2014-04-10 | Discharge: 2014-04-10 | Disposition: A | Discharge status: Home / Self Care | Payer: 59 | Source: Ambulatory Visit | Attending: Obstetrics & Gynecology | Admitting: Obstetrics & Gynecology

## 2014-04-10 ENCOUNTER — Encounter (HOSPITAL_BASED_OUTPATIENT_CLINIC_OR_DEPARTMENT_OTHER)
Admission: RE | Disposition: A | Discharge status: Home / Self Care | Payer: Self-pay | Source: Ambulatory Visit | Attending: Obstetrics & Gynecology

## 2014-04-10 DIAGNOSIS — D509 Iron deficiency anemia, unspecified: Secondary | ICD-10-CM | POA: Insufficient documentation

## 2014-04-10 DIAGNOSIS — Z87891 Personal history of nicotine dependence: Secondary | ICD-10-CM | POA: Insufficient documentation

## 2014-04-10 DIAGNOSIS — F329 Major depressive disorder, single episode, unspecified: Secondary | ICD-10-CM | POA: Diagnosis not present

## 2014-04-10 DIAGNOSIS — N751 Abscess of Bartholin's gland: Secondary | ICD-10-CM | POA: Diagnosis present

## 2014-04-10 DIAGNOSIS — F419 Anxiety disorder, unspecified: Secondary | ICD-10-CM | POA: Insufficient documentation

## 2014-04-10 DIAGNOSIS — Z91018 Allergy to other foods: Secondary | ICD-10-CM | POA: Insufficient documentation

## 2014-04-10 HISTORY — PX: BARTHOLIN CYST MARSUPIALIZATION: SHX5383

## 2014-04-10 HISTORY — DX: Major depressive disorder, single episode, moderate: F32.1

## 2014-04-10 HISTORY — DX: Presence of spectacles and contact lenses: Z97.3

## 2014-04-10 HISTORY — DX: Other diseases of Bartholin's gland: N75.8

## 2014-04-10 HISTORY — DX: Excessive and frequent menstruation with irregular cycle: N92.1

## 2014-04-10 HISTORY — DX: Decreased white blood cell count, unspecified: D72.819

## 2014-04-10 LAB — POCT HEMOGLOBIN-HEMACUE: HEMOGLOBIN: 10.7 g/dL — AB (ref 12.0–15.0)

## 2014-04-10 SURGERY — MARSUPIALIZATION, CYST, BARTHOLIN'S GLAND
Anesthesia: General | Site: Vulva

## 2014-04-10 MED ORDER — LACTATED RINGERS IV SOLN
INTRAVENOUS | Status: DC
  Filled 2014-04-10: qty 1000

## 2014-04-10 MED ORDER — LACTATED RINGERS IV SOLN
INTRAVENOUS | Status: DC
  Administered 2014-04-10: 15:00:00 via INTRAVENOUS
  Filled 2014-04-10: qty 1000

## 2014-04-10 MED ORDER — SODIUM CHLORIDE 0.9 % IR SOLN
Status: DC | PRN
  Administered 2014-04-10: 500 mL

## 2014-04-10 MED ORDER — IBUPROFEN 600 MG PO TABS: 600.0000 mg | ORAL_TABLET | Freq: Four times a day (QID) | ORAL | Status: DC | PRN

## 2014-04-10 MED ORDER — ONDANSETRON HCL 4 MG PO TABS
4.0000 mg | ORAL_TABLET | Freq: Four times a day (QID) | ORAL | Status: DC | PRN
  Filled 2014-04-10: qty 1

## 2014-04-10 MED ORDER — ONDANSETRON HCL 4 MG/2ML IJ SOLN
INTRAMUSCULAR | Status: DC | PRN
  Administered 2014-04-10: 4 mg via INTRAVENOUS

## 2014-04-10 MED ORDER — PROPOFOL 10 MG/ML IV BOLUS
INTRAVENOUS | Status: DC | PRN
  Administered 2014-04-10: 200 mg via INTRAVENOUS

## 2014-04-10 MED ORDER — FENTANYL CITRATE 0.05 MG/ML IJ SOLN
25.0000 ug | INTRAMUSCULAR | Status: DC | PRN
  Administered 2014-04-10: 25 ug via INTRAVENOUS
  Administered 2014-04-10: 50 ug via INTRAVENOUS
  Administered 2014-04-10: 25 ug via INTRAVENOUS
  Filled 2014-04-10: qty 1

## 2014-04-10 MED ORDER — KETOROLAC TROMETHAMINE 30 MG/ML IJ SOLN
INTRAMUSCULAR | Status: DC | PRN
  Administered 2014-04-10: 30 mg via INTRAVENOUS

## 2014-04-10 MED ORDER — METRONIDAZOLE 500 MG PO TABS: 500.0000 mg | ORAL_TABLET | Freq: Two times a day (BID) | ORAL | Status: AC

## 2014-04-10 MED ORDER — FENTANYL CITRATE 0.05 MG/ML IJ SOLN
INTRAMUSCULAR | Status: AC
  Filled 2014-04-10: qty 2

## 2014-04-10 MED ORDER — DEXAMETHASONE SODIUM PHOSPHATE 4 MG/ML IJ SOLN
INTRAMUSCULAR | Status: DC | PRN
  Administered 2014-04-10: 8 mg via INTRAVENOUS

## 2014-04-10 MED ORDER — MIDAZOLAM HCL 2 MG/2ML IJ SOLN
INTRAMUSCULAR | Status: AC
Start: 2014-04-10 — End: 2014-04-10
  Filled 2014-04-10: qty 2

## 2014-04-10 MED ORDER — OXYCODONE-ACETAMINOPHEN 5-325 MG PO TABS: 1.0000 | ORAL_TABLET | ORAL | Status: DC | PRN

## 2014-04-10 MED ORDER — LACTATED RINGERS IV SOLN
INTRAVENOUS | Status: DC
  Administered 2014-04-10: 12:00:00 via INTRAVENOUS
  Filled 2014-04-10: qty 1000

## 2014-04-10 MED ORDER — KETOROLAC TROMETHAMINE 30 MG/ML IJ SOLN
30.0000 mg | Freq: Once | INTRAMUSCULAR | Status: DC
  Filled 2014-04-10: qty 1

## 2014-04-10 MED ORDER — BUPIVACAINE-EPINEPHRINE 0.25% -1:200000 IJ SOLN
INTRAMUSCULAR | Status: DC | PRN
  Administered 2014-04-10: 20 mL

## 2014-04-10 MED ORDER — ONDANSETRON HCL 4 MG/2ML IJ SOLN
4.0000 mg | Freq: Four times a day (QID) | INTRAMUSCULAR | Status: DC | PRN
  Filled 2014-04-10: qty 2

## 2014-04-10 MED ORDER — OXYCODONE-ACETAMINOPHEN 5-325 MG PO TABS
1.0000 | ORAL_TABLET | ORAL | Status: DC | PRN
  Filled 2014-04-10: qty 2

## 2014-04-10 MED ORDER — FENTANYL CITRATE 0.05 MG/ML IJ SOLN
INTRAMUSCULAR | Status: DC | PRN
  Administered 2014-04-10: 25 ug via INTRAVENOUS
  Administered 2014-04-10: 50 ug via INTRAVENOUS
  Administered 2014-04-10: 25 ug via INTRAVENOUS

## 2014-04-10 MED ORDER — MIDAZOLAM HCL 5 MG/5ML IJ SOLN
INTRAMUSCULAR | Status: DC | PRN
  Administered 2014-04-10: 2 mg via INTRAVENOUS

## 2014-04-10 MED ORDER — HYDROMORPHONE HCL 1 MG/ML IJ SOLN
0.2000 mg | INTRAMUSCULAR | Status: DC | PRN
  Filled 2014-04-10: qty 1

## 2014-04-10 MED ORDER — FENTANYL CITRATE 0.05 MG/ML IJ SOLN
INTRAMUSCULAR | Status: AC
  Filled 2014-04-10: qty 4

## 2014-04-10 MED ORDER — LIDOCAINE HCL (CARDIAC) 20 MG/ML IV SOLN
INTRAVENOUS | Status: DC | PRN
  Administered 2014-04-10: 60 mg via INTRAVENOUS

## 2014-04-10 SURGICAL SUPPLY — 34 items
BLADE SURG 11 STRL SS (BLADE) ×3 IMPLANT
CATH ROBINSON RED A/P 16FR (CATHETERS) ×3 IMPLANT
DRAPE LG THREE QUARTER DISP (DRAPES) ×3 IMPLANT
DRAPE UNDERBUTTOCKS STRL (DRAPE) ×3 IMPLANT
ELECT REM PT RETURN 9FT ADLT (ELECTROSURGICAL) ×3
ELECTRODE REM PT RTRN 9FT ADLT (ELECTROSURGICAL) IMPLANT
GLOVE BIO SURGEON STRL SZ 6.5 (GLOVE) ×3 IMPLANT
GLOVE BIO SURGEONS STRL SZ 6.5 (GLOVE) ×2
GLOVE BIOGEL PI IND STRL 6.5 (GLOVE) IMPLANT
GLOVE BIOGEL PI IND STRL 7.0 (GLOVE) ×1 IMPLANT
GLOVE BIOGEL PI INDICATOR 6.5 (GLOVE) ×4
GLOVE BIOGEL PI INDICATOR 7.0 (GLOVE) ×2
GOWN STRL REUS W/TWL LRG LVL3 (GOWN DISPOSABLE) ×6 IMPLANT
LEGGING LITHOTOMY PAIR STRL (DRAPES) ×3 IMPLANT
NEEDLE HYPO 22GX1.5 SAFETY (NEEDLE) ×3 IMPLANT
NS IRRIG 500ML POUR BTL (IV SOLUTION) ×3 IMPLANT
PACK BASIN DAY SURGERY FS (CUSTOM PROCEDURE TRAY) ×3 IMPLANT
PAD OB MATERNITY 4.3X12.25 (PERSONAL CARE ITEMS) ×3 IMPLANT
PAD PREP 24X48 CUFFED NSTRL (MISCELLANEOUS) ×3 IMPLANT
PENCIL BUTTON HOLSTER BLD 10FT (ELECTRODE) ×2 IMPLANT
SPONGE SURGIFOAM ABS GEL 12-7 (HEMOSTASIS) IMPLANT
SUT PLAIN 3 0 SH 27IN (SUTURE) ×2 IMPLANT
SUT VIC AB 3-0 PS2 18 (SUTURE) ×6
SUT VIC AB 3-0 PS2 18XBRD (SUTURE) IMPLANT
SUT VIC AB 3-0 SH 27 (SUTURE) ×3
SUT VIC AB 3-0 SH 27X BRD (SUTURE) ×2 IMPLANT
SWAB CULTURE LIQ STUART DBL (MISCELLANEOUS) ×2 IMPLANT
SYR BULB IRRIGATION 50ML (SYRINGE) ×3 IMPLANT
TOWEL OR 17X24 6PK STRL BLUE (TOWEL DISPOSABLE) ×6 IMPLANT
TRAY DSU PREP LF (CUSTOM PROCEDURE TRAY) ×3 IMPLANT
TUBE ANAEROBIC SPECIMEN COL (MISCELLANEOUS) ×2 IMPLANT
TUBE CONNECTING 12'X1/4 (SUCTIONS) ×1
TUBE CONNECTING 12X1/4 (SUCTIONS) ×1 IMPLANT
YANKAUER SUCT BULB TIP NO VENT (SUCTIONS) ×2 IMPLANT

## 2014-04-10 NOTE — Op Note (Signed)
Procedure(s): INCISION AND DRAINAGE OF BARTHOLIN ABCESS  Procedure Note  Meghan [name] female 39 y.o. 04/10/2014  Procedure(s) and Anesthesia Type:    * INCISION AND DRAINAGE OF BARTHOLIN ABCESS  - General  Surgeon(s) and Role:    * Sanjuana Kava, MD - Primary   Indications: The patient was taken to OR for right sided Bartholin's abscess lower pelvic and vulvar pain.       Surgeon: Caffie Damme   Assistants: none  Anesthesia: General LMA anesthesia  ASA Class: 3    Procedure Detail  INCISION AND DRAINAGE OF BARTHOLIN ABCESS   Findings: Exam under anesthesia: Large Right vulvar defect with purulent material eminating from area. Swollen Bartholin's  Cyst / abscess. Necrotic vulvar tissue.   Estimated Blood Loss:  less than 50 mL         Drains: none         Total IV Fluids: 800 ml  Blood Given: none          Specimens: Bartholin's cyst wall / Anerobic and aerobic culture         Implants: none        Complications:  * No complications entered in OR log *         Disposition: PACU - hemodynamically stable.         Condition: stable  PROCEDURE: patient taken to OR where General LMA anesthesia was found to be adequate, findings are as above. Necrotic Bartholin's cyst wall was debrided using 11 blade scalpel and metzenbaum scissors and handed off to be sent to Pathology. Purulent material was swabbed for culture.  Right vaginal tract opened up with Metzenbaum scissors and purulent material evacuated. Area irrigated copiously with sterile normal saline.  Cavity was closed with 2-0 vicryl on PS 2 and skin closed with interrupted sutures of 3-0 plain gut.  Patient tolerated procedure well. Counts were correct x 3.   Aedan Geimer STACIA

## 2014-04-10 NOTE — Anesthesia Preprocedure Evaluation (Signed)
Anesthesia Evaluation  Patient identified by MRN, date of birth, ID band Patient awake    Reviewed: Allergy & Precautions, H&P , NPO status , Patient's Chart, lab work & pertinent test results  Airway Mallampati: II  TM Distance: >3 FB Neck ROM: full    Dental no notable dental hx. (+) Teeth Intact, Dental Advisory Given   Pulmonary neg pulmonary ROS, former smoker,  breath sounds clear to auscultation  Pulmonary exam normal       Cardiovascular Exercise Tolerance: Good negative cardio ROS  Rhythm:regular Rate:Normal     Neuro/Psych PSYCHIATRIC DISORDERS Anxiety Depression Panic attacksnegative neurological ROS  negative psych ROS   GI/Hepatic negative GI ROS, Neg liver ROS,   Endo/Other  negative endocrine ROS  Renal/GU negative Renal ROS  negative genitourinary   Musculoskeletal   Abdominal   Peds  Hematology negative hematology ROS (+) pancytopenia   Anesthesia Other Findings   Reproductive/Obstetrics negative OB ROS                             Anesthesia Physical Anesthesia Plan  ASA: II  Anesthesia Plan: General   Post-op Pain Management:    Induction: Intravenous  Airway Management Planned: LMA  Additional Equipment:   Intra-op Plan:   Post-operative Plan:   Informed Consent: I have reviewed the patients History and Physical, chart, labs and discussed the procedure including the risks, benefits and alternatives for the proposed anesthesia with the patient or authorized representative who has indicated his/her understanding and acceptance.   Dental Advisory Given  Plan Discussed with: CRNA and Surgeon  Anesthesia Plan Comments:         Anesthesia Quick Evaluation

## 2014-04-10 NOTE — H&P (Signed)
Meghan [name] is an 39 y.o. female recently diagnosed with Right Bartholin's Abscess. She complains of swollen R groin, feels like a knot. Has become bigger since noticing it 2 days ago. Patient notes that in the same area she had a smaller cyst several months ago but it resolved over time. She notes that the pain is severe and it is hard to walk.    Pertinent Gynecological History: Menses: regular every 28 days without intermenstrual spotting Bleeding: normal Contraception: tubal ligation DES exposure: denies Blood transfusions: none Sexually transmitted diseases: no past history Previous GYN Procedures: DNC and LEEP  Last mammogram: n/a  Last pap: normal Date: 04/15/13 OB History: G4, P3   Menstrual History: Menarche age: 56  Patient's last menstrual period was 03/23/2014 (exact date).    Past Medical History  Diagnosis Date  . Anxiety   . Moderate major depression   . Menorrhagia with irregular cycle   . Leukopenia   . Wears contact lenses   . 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 Infection of Bartholin's gland     Past Surgical History  Procedure Laterality Date  . Cesarean section  x3  last one 01-17-2005    last one with bilateral tubal ligation   . Dilation and curettage of uterus  1999    Family History  Problem Relation Age of Onset  . Cancer Maternal Grandmother     leukemia  . Alcohol abuse Father   . Drug abuse Sister   . Depression Sister   . Drug abuse Brother   . Suicidality Neg Hx     Social History:  reports that she quit smoking about 16 years ago. Her smoking use included Cigarettes. She quit after .5 years of use. She has never used smokeless tobacco. She reports that she drinks alcohol. She reports that she does not use illicit drugs.  Allergies:  Allergies  Allergen Reactions  . Tomato Hives    Prescriptions prior to admission  Medication Sig Dispense Refill Last Dose  . ibuprofen  (ADVIL,MOTRIN) 200 MG tablet Take 600 mg by mouth every 6 (six) hours as needed.   Past Week at Unknown time  . metroNIDAZOLE (FLAGYL) 500 MG tablet Take 500 mg by mouth 2 (two) times daily.   04/09/2014 at Unknown time  . oxyCODONE-acetaminophen (PERCOCET/ROXICET) 5-325 MG per tablet Take by mouth every 4 (four) hours as needed for severe pain.   04/09/2014 at Unknown time    Review of Systems  Constitutional: Negative for fever and chills.  HENT: Negative for hearing loss.   Eyes: Negative for blurred vision.  Cardiovascular: Negative for chest pain and orthopnea.  Gastrointestinal: Negative for heartburn and nausea.  Musculoskeletal: Negative for myalgias and neck pain.  Neurological: Negative for dizziness, tingling and headaches.    Blood pressure 130/70, pulse 57, temperature 98.4 F (36.9 C), temperature source Oral, resp. rate 10, height 5\' 7"  (1.702 m), weight 91.74 kg (202 lb 4 oz), last menstrual period 03/23/2014, SpO2 100 %. Physical Exam  Constitutional: She is oriented to person, place, and time. She appears well-developed and well-nourished.  HENT:  Head: Normocephalic.  Eyes: Pupils are equal, round, and reactive to light.  Neck: Normal range of motion.  Cardiovascular: Normal rate and regular rhythm.   GI: Soft. Bowel sounds are normal.  Genitourinary:  Vulva: no erythema, excoriation, atrophy, discoloration, lesions, vesicles, or swelling and abscess, mass, and tenderness; Bartholin's Cyst / abscess with tract posterior  vagina. Mons: no erythema, excoriation, atrophy, lesions, masses, swelling, tenderness, or vesicles/ ulcers and normal. Labia Majora: no erythema, excoriation, atrophy, discoloration, lesions, masses, swelling, tenderness, or vesicles/ ulcers and normal. Labia Minora: no erythema, excoriation, atrophy, discoloration, lesions, vesicles, masses, swelling, or tenderness and normal. Introitus: normal. Bartholin's Gland: normal. Vagina: no erythema, atrophy,  ulcers, swelling, masses, tenderness, prolapse, or blood present and lesions and discharge present. Vaginal Cuff: intact, well-healed, and no tenderness. Cervix: no lesions, discharge, bleeding, or cervical motion tenderness and grossly normal. Uterus: normal size and contour and midline, mobile, non-tender, and no uterine prolapse  Musculoskeletal: Normal range of motion.  Neurological: She is alert and oriented to person, place, and time. She has normal reflexes.    Results for orders placed or performed during the hospital encounter of 04/10/14 (from the past 24 hour(s))  Hemoglobin-hemacue, POC     Status: Abnormal   Collection Time: 04/10/14 11:51 AM  Result Value Ref Range   Hemoglobin 10.7 (L) 12.0 - 15.0 g/dL    No results found.  Assessment/Plan: 39 yo with Right Bartholin's abscess The tract was very long and went all the way to posterior vagina.  I&D attempted at surface, but was not able to clear it out completely.  Patient also wasn't tolerating procedure well. To OR today  for examination under anesthesia to open the whole cyst, drain it and perhaps do marsupialization.   Sharla Tankard, [place] 04/10/2014, 11:53 AM

## 2014-04-10 NOTE — Transfer of Care (Signed)
Immediate Anesthesia Transfer of Care Note  Patient: Meghan [name]  Procedure(s) Performed: Procedure(s) (LRB): INCISION AND DRAINAGE OF BARTHOLIN ABCESS  (N/A)  Patient Location: PACU  Anesthesia Type: General  Level of Consciousness: awake, oriented, sedated and patient cooperative  Airway & Oxygen Therapy: Patient Spontanous Breathing and Patient connected to face mask oxygen  Post-op Assessment: Report given to PACU RN and Post -op Vital signs reviewed and stable  Post vital signs: Reviewed and stable  Complications: No apparent anesthesia complications

## 2014-04-10 NOTE — Anesthesia Procedure Notes (Signed)
Procedure Name: LMA Insertion Date/Time: 04/10/2014 12:12 PM Performed by: Denna Haggard D Pre-anesthesia Checklist: Patient identified, Emergency Drugs available, Suction available and Patient being monitored Patient Re-evaluated:Patient Re-evaluated prior to inductionOxygen Delivery Method: Circle System Utilized Preoxygenation: Pre-oxygenation with 100% oxygen Intubation Type: IV induction Ventilation: Mask ventilation without difficulty LMA: LMA inserted LMA Size: 4.0 Number of attempts: 1 Airway Equipment and Method: Bite block Placement Confirmation: positive ETCO2 Tube secured with: Tape Dental Injury: Teeth and Oropharynx as per pre-operative assessment

## 2014-04-10 NOTE — Discharge Instructions (Signed)
Bartholin's Cyst or Abscess Bartholin's glands are small glands located within the folds of skin (labia) along the sides of the lower opening of the vagina (birth canal). A cyst may develop when the duct of the gland becomes blocked. When this happens, fluid that accumulates within the cyst can become infected. This is known as an abscess. The Bartholin gland produces a mucous fluid to lubricate the outside of the vagina during sexual intercourse. SYMPTOMS   Patients with a small cyst may not have any symptoms.  Mild discomfort to severe pain depending on the size of the cyst and if it is infected (abscess).  Pain, redness, and swelling around the lower opening of the vagina.  Painful intercourse.  Pressure in the perineal area.  Swelling of the lips of the vagina (labia).  The cyst or abscess can be on one side or both sides of the vagina. DIAGNOSIS   A large swelling is seen in the lower vagina area by your caregiver.  Painful to touch.  Redness and pain, if it is an abscess. TREATMENT   Sometimes the cyst will go away on its own.  Apply warm wet compresses to the area or take hot sitz baths several times a day.  An incision to drain the cyst or abscess with local anesthesia.  Culture the pus, if it is an abscess.  Antibiotic treatment, if it is an abscess.  Cut open the gland and suture the edges to make the opening of the gland bigger (marsupialization).  Remove the whole gland if the cyst or abscess returns. PREVENTION   Practice good hygiene.  Clean the vaginal area with a mild soap and soft cloth when bathing.  Do not rub hard in the vaginal area when bathing.  Protect the crotch area with a padded cushion if you take long bike rides or ride horses.  Be sure you are well lubricated when you have sexual intercourse. HOME CARE INSTRUCTIONS   If your cyst or abscess was opened, a small piece of gauze, or a drain, may have been placed in the wound to allow  drainage. Do not remove this gauze or drain unless directed by your caregiver.  Wear feminine pads, not tampons, as needed for any drainage or bleeding.  If antibiotics were prescribed, take them exactly as directed. Finish the entire course.  Only take over-the-counter or prescription medicines for pain, discomfort, or fever as directed by your caregiver. SEEK IMMEDIATE MEDICAL CARE IF:   You have an increase in pain, redness, swelling, or drainage.  You have bleeding from the wound which results in the use of more than the number of pads suggested by your caregiver in 24 hours.  You have chills.  You have a fever.  You develop any new problems (symptoms) or aggravation of your existing condition. MAKE SURE YOU:   Understand these instructions.  Will watch your condition.  Will get help right away if you are not doing well or get worse. Document Released: 03/10/2005 Document Revised: 06/02/2011 Document Reviewed: 10/27/2007 [facility] Patient Information 2015 [place], [place]. This information is not intended to replace advice given to you by your health care provider. Make sure you discuss any questions you have with your health care provider.  Post Anesthesia Home Care Instructions  Activity: Get plenty of rest for the remainder of the day. A responsible adult should stay with you for 24 hours following the procedure.  For the next 24 hours, DO NOT: -Drive a car -Paediatric nurse -Drink alcoholic  beverages -Take any medication unless instructed by your physician -Make any legal decisions or sign important papers.  Meals: Start with liquid foods such as gelatin or soup. Progress to regular foods as tolerated. Avoid greasy, spicy, heavy foods. If nausea and/or vomiting occur, drink only clear liquids until the nausea and/or vomiting subsides. Call your physician if vomiting continues.  Special Instructions/Symptoms: Your throat may feel dry or sore from the anesthesia or the  breathing tube placed in your throat during surgery. If this causes discomfort, gargle with warm salt water. The discomfort should disappear within 24 hours.

## 2014-04-10 NOTE — Anesthesia Postprocedure Evaluation (Signed)
  Anesthesia Post-op Note  Patient: Meghan [name]  Procedure(s) Performed: Procedure(s) (LRB): INCISION AND DRAINAGE OF BARTHOLIN ABCESS  (N/A)  Patient Location: PACU  Anesthesia Type: General  Level of Consciousness: awake and alert   Airway and Oxygen Therapy: Patient Spontanous Breathing  Post-op Pain: mild  Post-op Assessment: Post-op Vital signs reviewed, Patient's Cardiovascular Status Stable, Respiratory Function Stable, Patent Airway and No signs of Nausea or vomiting  Last Vitals:  Filed Vitals:   04/10/14 1315  BP: 121/67  Pulse: 76  Temp:   Resp: 18    Post-op Vital Signs: stable   Complications: No apparent anesthesia complications

## 2014-04-11 ENCOUNTER — Encounter (HOSPITAL_BASED_OUTPATIENT_CLINIC_OR_DEPARTMENT_OTHER): Payer: Self-pay | Admitting: Obstetrics & Gynecology

## 2014-04-13 LAB — CULTURE, ROUTINE-ABSCESS: Culture: NO GROWTH

## 2014-04-15 LAB — ANAEROBIC CULTURE

## 2014-06-26 ENCOUNTER — Other Ambulatory Visit: Payer: Self-pay | Admitting: Obstetrics & Gynecology

## 2014-06-27 LAB — CYTOLOGY - PAP

## 2014-07-21 ENCOUNTER — Other Ambulatory Visit: Payer: Self-pay | Admitting: Hematology and Oncology

## 2014-07-21 DIAGNOSIS — D509 Iron deficiency anemia, unspecified: Secondary | ICD-10-CM

## 2014-07-24 ENCOUNTER — Other Ambulatory Visit: Payer: Self-pay

## 2014-07-28 ENCOUNTER — Telehealth: Payer: Self-pay | Admitting: Hematology and Oncology

## 2014-07-28 NOTE — Telephone Encounter (Signed)
pt called to r/s appt...done....pt ok and aware of new d.t °

## 2014-07-31 ENCOUNTER — Ambulatory Visit: Payer: Self-pay

## 2014-07-31 ENCOUNTER — Ambulatory Visit: Payer: Self-pay | Admitting: Hematology and Oncology

## 2014-08-28 ENCOUNTER — Ambulatory Visit: Payer: Self-pay

## 2014-08-28 ENCOUNTER — Other Ambulatory Visit: Payer: Self-pay | Admitting: Obstetrics & Gynecology

## 2014-08-28 ENCOUNTER — Ambulatory Visit: Payer: Self-pay | Admitting: Hematology and Oncology

## 2014-08-28 DIAGNOSIS — R102 Pelvic and perineal pain: Secondary | ICD-10-CM

## 2014-09-01 ENCOUNTER — Encounter: Payer: Self-pay | Admitting: *Deleted

## 2014-09-04 ENCOUNTER — Other Ambulatory Visit: Payer: Self-pay | Admitting: Hematology and Oncology

## 2014-09-04 ENCOUNTER — Ambulatory Visit: Payer: Self-pay

## 2014-09-06 ENCOUNTER — Ambulatory Visit
Admission: RE | Admit: 2014-09-06 | Discharge: 2014-09-06 | Disposition: A | Discharge status: Home / Self Care | Payer: 59 | Source: Ambulatory Visit | Attending: Obstetrics & Gynecology | Admitting: Obstetrics & Gynecology

## 2014-09-06 DIAGNOSIS — R102 Pelvic and perineal pain: Secondary | ICD-10-CM

## 2014-09-06 MED ORDER — GADOBENATE DIMEGLUMINE 529 MG/ML IV SOLN
20.0000 mL | Freq: Once | INTRAVENOUS | Status: AC | PRN
  Administered 2014-09-06: 20 mL via INTRAVENOUS

## 2015-02-09 ENCOUNTER — Other Ambulatory Visit: Payer: Self-pay | Admitting: Hematology and Oncology

## 2015-02-09 ENCOUNTER — Encounter (HOSPITAL_COMMUNITY): Payer: Self-pay | Admitting: Psychiatry

## 2015-02-09 ENCOUNTER — Other Ambulatory Visit (HOSPITAL_COMMUNITY): Payer: 59 | Attending: Psychiatry | Admitting: Psychiatry

## 2015-02-09 DIAGNOSIS — F410 Panic disorder [episodic paroxysmal anxiety] without agoraphobia: Secondary | ICD-10-CM | POA: Diagnosis not present

## 2015-02-09 DIAGNOSIS — F331 Major depressive disorder, recurrent, moderate: Secondary | ICD-10-CM

## 2015-02-09 DIAGNOSIS — F17210 Nicotine dependence, cigarettes, uncomplicated: Secondary | ICD-10-CM | POA: Insufficient documentation

## 2015-02-09 DIAGNOSIS — F411 Generalized anxiety disorder: Secondary | ICD-10-CM | POA: Diagnosis not present

## 2015-02-09 DIAGNOSIS — D509 Iron deficiency anemia, unspecified: Secondary | ICD-10-CM | POA: Diagnosis not present
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
## 2015-02-09 NOTE — Progress Notes (Signed)
    Daily Group Progress Note  Program: IOP  Group Time: 9:00-10:30  Participation Level: Active  Behavioral Response: Appropriate  Type of Therapy:  Group Therapy  Summary of Progress: Pt. Met for intake assessment with case manager. Pt. Presented as tearful, but talkative. Pt. shared death of boyfriend last week and that the death triggered sadness related to death of her father.      Group Time: 10:30-12:00  Participation Level:  Active  Behavioral Response: Appropriate  Type of Therapy: Psycho-education Group  Summary of Progress: Pt. Participated in grief and loss with Jeanella Craze.   Nancie Neas, LPC

## 2015-02-12 ENCOUNTER — Other Ambulatory Visit (HOSPITAL_COMMUNITY): Payer: 59 | Admitting: Psychiatry

## 2015-02-12 DIAGNOSIS — F331 Major depressive disorder, recurrent, moderate: Secondary | ICD-10-CM | POA: Diagnosis not present

## 2015-02-12 NOTE — Progress Notes (Signed)
    Daily Group Progress Note  Program: IOP  Group Time: 9:00-10:30  Participation Level: Active  Behavioral Response: Appropriate  Type of Therapy:  Group Therapy  Summary of Progress: Pt. Presents as depressed, tearful. Pt. Reports that she continues to be challenged by the grief process, feelings of intense sadness, anger at her boyfriend for leaving her, and guilt. Pt. Discussed pressure she receives from friends and family to move quickly through her grief. Pt. Reports that she took her children out of town to visit her mother over the weekend and that it was a good experience to be supported and have change of environment.      Group Time: 10:30-12:00  Participation Level:  Active  Behavioral Response: Appropriate  Type of Therapy: Psycho-education Group  Summary of Progress: Pt. Participated in discussion about managing anxiety with use of grounding exercises focused on mindfulness and sensory awareness.   Nancie Neas, LPC

## 2015-02-13 ENCOUNTER — Telehealth (HOSPITAL_COMMUNITY): Payer: Self-pay | Admitting: Psychiatry

## 2015-02-13 ENCOUNTER — Other Ambulatory Visit (HOSPITAL_COMMUNITY): Payer: 59 | Admitting: Psychiatry

## 2015-02-13 NOTE — Telephone Encounter (Signed)
Pt informed writer that she wouldn't be attending MH-IOP today due to ill child.  Plans to return tomorrow.

## 2015-02-14 ENCOUNTER — Encounter (HOSPITAL_COMMUNITY): Payer: Self-pay | Admitting: Psychiatry

## 2015-02-14 ENCOUNTER — Other Ambulatory Visit (HOSPITAL_COMMUNITY): Payer: 59 | Admitting: Psychiatry

## 2015-02-14 DIAGNOSIS — F331 Major depressive disorder, recurrent, moderate: Secondary | ICD-10-CM
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
## 2015-02-16 ENCOUNTER — Other Ambulatory Visit (HOSPITAL_COMMUNITY): Payer: 59

## 2015-02-19 ENCOUNTER — Other Ambulatory Visit (HOSPITAL_COMMUNITY): Payer: 59 | Admitting: Psychiatry

## 2015-02-19 DIAGNOSIS — F331 Major depressive disorder, recurrent, moderate: Secondary | ICD-10-CM

## 2015-02-20 ENCOUNTER — Other Ambulatory Visit (HOSPITAL_COMMUNITY): Payer: 59 | Admitting: Psychiatry

## 2015-02-20 DIAGNOSIS — F331 Major depressive disorder, recurrent, moderate: Secondary | ICD-10-CM | POA: Diagnosis not present

## 2015-02-20 NOTE — Progress Notes (Signed)
    Daily Group Progress Note  Program: IOP  Group Time: 9:00-10:30  Participation Level: Active  Behavioral Response: Appropriate  Type of Therapy:  Psycho-education Group  Summary of Progress: Pt. Participated in medication management group with Jiles Garter.      Group Time: 10:30-12:00  Participation Level:  Active  Behavioral Response: Appropriate  Type of Therapy: Group Therapy  Summary of Progress: Pt. Continues to actively grieve death of her boyfriend, appropriately tearful, depressed. Pt. Rep[place], but was not able to enjoy the trip because she missed her boyfriend. Pt. Reported that she did not feel supported by family members who are ready for her to move on and stop grieving.   Nancie Neas, LPC

## 2015-02-20 NOTE — Progress Notes (Signed)
    Daily Group Progress Note  Program: IOP  Group Time: 9:00-10:30  Participation Level: Active  Behavioral Response: Appropriate  Type of Therapy:  Group Therapy  Summary of Progress: Pt. Presents as sad, but responsive, complained of allergy symptoms and difficulty sleeping. Pt. Reports that she wakes at 5:00am every morning and cannot go back to sleep. Pt. Was encouraged to go ahead and get up which will likely allow her to go to sleep earlier the next day. Pt. Continues to actively grieve her boyfriend and trying to adjust to day-to-day with her children without him. Pt. Continues to report the lack of substantial family support and feeling misunderstood by her family.     Group Time: 10:30-12:00  Participation Level:  Active  Behavioral Response: Appropriate  Type of Therapy: Psycho-education Group  Summary of Progress: Pt. Watched and discussed Marilynn Latino video on the importance of self-compassion (i.e., kindness to self, recognition of common humanity, and mindfulness.)  Nancie Neas, LPC

## 2015-02-21 ENCOUNTER — Other Ambulatory Visit (HOSPITAL_COMMUNITY): Payer: 59 | Admitting: Psychiatry

## 2015-02-21 DIAGNOSIS — F331 Major depressive disorder, recurrent, moderate: Secondary | ICD-10-CM

## 2015-02-21 NOTE — Progress Notes (Signed)
    Daily Group Progress Note  Program: IOP  Group Time: 9:00-10:30  Participation Level: Active  Behavioral Response: Appropriate  Type of Therapy:  Group Therapy  Summary of Progress: Pt.presents with significantly improved mood, talkative, engaged in the group process. Pt. Reports that she picked up her boyfriend's ashes yesterday and had a meaningful conversation with his mother. Pt. Reported that she got her hair done, visited family, did zumba with her family, and slept well. Pt. Reports that she continues to grieve, but expressed gratitude for having a good. Day.     Group Time: 10:30-12:00  Participation Level:  Active  Behavioral Response: Appropriate  Type of Therapy: Psycho-education Group  Summary of Progress: Pt. Participated in discussion about highly sensitive people, patterns of people pleasing, and compliant boundaries in relationships.   Nancie Neas, LPC

## 2015-02-22 ENCOUNTER — Other Ambulatory Visit (HOSPITAL_COMMUNITY): Payer: 59 | Attending: Psychiatry | Admitting: Psychiatry

## 2015-02-22 DIAGNOSIS — F419 Anxiety disorder, unspecified: Secondary | ICD-10-CM | POA: Insufficient documentation

## 2015-02-22 DIAGNOSIS — F331 Major depressive disorder, recurrent, moderate: Secondary | ICD-10-CM | POA: Diagnosis present

## 2015-02-22 NOTE — Progress Notes (Signed)
    Daily Group Progress Note  Program: IOP  Group Time: 9:00-10:30  Participation Level: Active  Behavioral Response: Appropriate  Type of Therapy:  Group Therapy  Summary of Progress: Pt. Continues to present with brightened mood, talkative, engaged in the group process, less tearful. Pt. Reported that she was having a good day, but received news that her boyfriend's (who was killed almost 3 weeks ago) best friend was killed last night. Pt. Expressed sadness and confusion related to the loss of her boyfriend's friend.      Group Time: 10:30-12:00  Participation Level:  Active  Behavioral Response: Appropriate  Type of Therapy: Psycho-education Group  Summary of Progress: Pt. Participated in discussion about use of aromatherapy for stimulating energy and grounding and developing understanding of personality styles (i.e. Compliant, controlling, and avoidant) and how they affect development of boundaries in relationships.  Nancie Neas, LPC

## 2015-02-23 ENCOUNTER — Other Ambulatory Visit (HOSPITAL_COMMUNITY): Payer: 59 | Admitting: Psychiatry

## 2015-02-23 DIAGNOSIS — F331 Major depressive disorder, recurrent, moderate: Secondary | ICD-10-CM | POA: Diagnosis not present
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
## 2015-02-26 ENCOUNTER — Other Ambulatory Visit (HOSPITAL_COMMUNITY): Payer: 59 | Admitting: Psychiatry

## 2015-02-26 DIAGNOSIS — F331 Major depressive disorder, recurrent, moderate: Secondary | ICD-10-CM | POA: Diagnosis not present
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
## 2015-02-27 ENCOUNTER — Other Ambulatory Visit (HOSPITAL_COMMUNITY): Payer: 59 | Admitting: Psychiatry

## 2015-02-27 DIAGNOSIS — F331 Major depressive disorder, recurrent, moderate: Secondary | ICD-10-CM

## 2015-02-27 NOTE — Progress Notes (Signed)
    Daily Group Progress Note  Program: IOP  Group Time: 9:00-10:30  Participation Level: Active  Behavioral Response: Appropriate  Type of Therapy:  Group Therapy  Summary of Progress: Pt. Presented as tired and lethargic, but talkative and engaged in the group process. Pt. Reported that she cried a lot last night, recognizes that she continues to grieve and that some days are better than others.      Group Time: 10:30-12:00  Participation Level:  Active  Behavioral Response: Appropriate  Type of Therapy: Psycho-education Group  Summary of Progress: Pt. Watched [employer] video about use of vulnerability and destructive coping behaviors. Pt. Expressed understanding patterns of numbing due to addiction and excessive distraction.  Nancie Neas, LPC

## 2015-02-28 ENCOUNTER — Other Ambulatory Visit (HOSPITAL_COMMUNITY): Payer: 59 | Admitting: Psychiatry

## 2015-02-28 DIAGNOSIS — F331 Major depressive disorder, recurrent, moderate: Secondary | ICD-10-CM

## 2015-02-28 MED ORDER — MIRTAZAPINE 30 MG PO TABS: 30.0000 mg | ORAL_TABLET | Freq: Every day | ORAL | Status: DC

## 2015-02-28 NOTE — Progress Notes (Signed)
Restart mirtazepine 30 mg hs as it helped in the past

## 2015-02-28 NOTE — Progress Notes (Signed)
    Daily Group Progress Note  Program: IOP  Group Time: 9:00-10:30  Participation Level: Active  Behavioral Response: Appropriate  Type of Therapy:  Group Therapy  Summary of Progress: Pt. Presented with brightened affect, continues to grieve loss of her boyfriend, talkative and insightful about relationship patterns and how her behavior contributes to her suffering. Pt reflected on her anger caused by people she thought were friends and discussed her intention to move forward positively and to not allow their behavior to affect her mood.     Group Time:  10:30-12:00  Participation Level:  Active  Behavioral Response: Appropriate  Type of Therapy: Psycho-education Group  Summary of Progress: Pt. Participated in discussion about cognitive distortions including all-or-nothing thinking, overgeneralization, mental filter, discounting the positive, jumping to conclusions, and personalization.   Nancie Neas, LPC

## 2015-03-01 ENCOUNTER — Other Ambulatory Visit (HOSPITAL_COMMUNITY): Payer: 59

## 2015-03-02 ENCOUNTER — Other Ambulatory Visit (HOSPITAL_COMMUNITY): Payer: 59 | Admitting: Psychiatry

## 2015-03-02 DIAGNOSIS — F331 Major depressive disorder, recurrent, moderate: Secondary | ICD-10-CM

## 2015-03-02 NOTE — Progress Notes (Signed)
    Daily Group Progress Note  Program: IOP  Group Time: 9:00-10:30  Participation Level: Active  Behavioral Response: Appropriate  Type of Therapy:  Group Therapy  Summary of Progress: Pt. Presented as tired, lethargic, sad. Pt. Attributed her sleepiness to remeron. Pt. Was quiet, appeared disengaged for most of the group process. Pt. Indicated concerns about work stress and return to work.     Group Time: 10:30-12:00  Participation Level:  Active  Behavioral Response: Appropriate  Type of Therapy: Psycho-education Group  Summary of Progress: Pt. Participated in grief and loss group with Jeanella Craze.  Nancie Neas, LPC

## 2015-03-05 ENCOUNTER — Other Ambulatory Visit (HOSPITAL_COMMUNITY): Payer: 59 | Admitting: Psychiatry

## 2015-03-05 DIAGNOSIS — F331 Major depressive disorder, recurrent, moderate: Secondary | ICD-10-CM

## 2015-03-06 ENCOUNTER — Other Ambulatory Visit (HOSPITAL_COMMUNITY): Payer: 59 | Admitting: Psychiatry

## 2015-03-06 DIAGNOSIS — F331 Major depressive disorder, recurrent, moderate: Secondary | ICD-10-CM

## 2015-03-06 NOTE — Progress Notes (Signed)
    Daily Group Progress Note  Program: IOP  Group Time: 9:00-10:30  Participation Level: Active  Behavioral Response: Appropriate  Type of Therapy:  Group Therapy  Summary of Progress: Pt. Presented with brightened affect, less tired than yesterday's group. Pt. Discussed challenges of living with her cousin temporarily while she is grieving the loss of her boyfriend.      Group Time: 10:30-12:00  Participation Level:  Active  Behavioral Response: Appropriate  Type of Therapy: Psycho-education Group  Summary of Progress: Pt. Left group shortly to pick her son up from school. Pt. Watched the [employer] and participated in breath focused meditation exercise.  Eloise Levels, Ph.D., [facility]
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
## 2015-03-06 NOTE — Progress Notes (Signed)
    Daily Group Progress Note  Program: IOP  Group Time: 9:00-10:30  Participation Level: Active  Behavioral Response: Appropriate  Type of Therapy:  Psycho-education Group  Summary of Progress: Pt. Participated in medication education group with [place].     Group Time: 10:30-12:00  Participation Level:  Active  Behavioral Response: Appropriate  Type of Therapy: Group Therapy  Summary of Progress: Pt. Presented with brightened affect, appeared quiet and lethargic. Pt. Continues to work on feelings related to grief due to death of her boyfriend. Pt. Reports that she is spending more time with her mother who is being supportive and is developing a loving and supportive relationship with her boyfriend's family.  Eloise Levels, Ph.D., [facility]

## 2015-03-07 ENCOUNTER — Other Ambulatory Visit (HOSPITAL_COMMUNITY): Payer: 59 | Admitting: Psychiatry

## 2015-03-07 DIAGNOSIS — F331 Major depressive disorder, recurrent, moderate: Secondary | ICD-10-CM | POA: Diagnosis not present
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
## 2015-03-07 NOTE — Patient Instructions (Signed)
Pt completed MH-IOP today.  Will follow up with Dr. Doyne Keel on 03-13-15 @ 3 pm and Audelia Acton, LCSW on 04-04-15 @ 9 a.m.  Encouraged support groups.  Return to work on 03-20-15.

## 2015-03-08 ENCOUNTER — Other Ambulatory Visit (HOSPITAL_COMMUNITY): Payer: 59

## 2015-03-08 NOTE — Progress Notes (Signed)
    Daily Group Progress Note  Program: IOP  Group Time: 9:00-10:30  Participation Level: Active  Behavioral Response: Appropriate  Type of Therapy:  Group Therapy  Summary of Progress: Pt. Prepared for discharge. Pt. Continues to grieve loss of her boyfriend, preparing for return to work. Pt. Reported that "I am feeling much better", furthering commitment to self-care and setting healthy boundaries for herself in family and friend relationships.      Group Time: 10:30-12:00  Participation Level:  Active  Behavioral Response: Appropriate  Type of Therapy: Psycho-education Group  Summary of Progress: Pt. Participated in "Just for Today", learning to live life in the moment exercise.  Nancie Neas, LPC

## 2015-03-09 ENCOUNTER — Other Ambulatory Visit (HOSPITAL_COMMUNITY): Payer: 59

## 2015-03-12 ENCOUNTER — Other Ambulatory Visit (HOSPITAL_COMMUNITY): Payer: 59

## 2015-03-13 ENCOUNTER — Encounter (HOSPITAL_COMMUNITY): Payer: Self-pay | Admitting: Psychiatry

## 2015-03-13 ENCOUNTER — Other Ambulatory Visit (HOSPITAL_COMMUNITY): Payer: 59

## 2015-03-13 ENCOUNTER — Ambulatory Visit (INDEPENDENT_AMBULATORY_CARE_PROVIDER_SITE_OTHER): Payer: 59 | Admitting: Psychiatry

## 2015-03-13 VITALS — BP 127/74 | HR 88 | Ht 66.0 in | Wt 187.8 lb

## 2015-03-13 DIAGNOSIS — F1010 Alcohol abuse, uncomplicated: Secondary | ICD-10-CM | POA: Insufficient documentation

## 2015-03-13 DIAGNOSIS — F410 Panic disorder [episodic paroxysmal anxiety] without agoraphobia: Secondary | ICD-10-CM | POA: Diagnosis not present

## 2015-03-13 DIAGNOSIS — F332 Major depressive disorder, recurrent severe without psychotic features: Secondary | ICD-10-CM

## 2015-03-13 DIAGNOSIS — F411 Generalized anxiety disorder: Secondary | ICD-10-CM

## 2015-03-13 MED ORDER — MIRTAZAPINE 45 MG PO TABS: 45.0000 mg | ORAL_TABLET | Freq: Every day | ORAL | Status: DC
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
## 2015-03-14 ENCOUNTER — Other Ambulatory Visit (HOSPITAL_COMMUNITY): Payer: 59

## 2015-03-15 ENCOUNTER — Other Ambulatory Visit (HOSPITAL_COMMUNITY): Payer: 59

## 2015-03-16 ENCOUNTER — Other Ambulatory Visit (HOSPITAL_COMMUNITY): Payer: 59

## 2015-03-20 ENCOUNTER — Other Ambulatory Visit (HOSPITAL_COMMUNITY): Payer: 59

## 2015-03-21 ENCOUNTER — Other Ambulatory Visit (HOSPITAL_COMMUNITY): Payer: 59

## 2015-03-22 ENCOUNTER — Other Ambulatory Visit (HOSPITAL_COMMUNITY): Payer: 59

## 2015-03-23 ENCOUNTER — Other Ambulatory Visit (HOSPITAL_COMMUNITY): Payer: 59

## 2015-03-27 ENCOUNTER — Other Ambulatory Visit (HOSPITAL_COMMUNITY): Payer: 59

## 2015-03-28 ENCOUNTER — Other Ambulatory Visit (HOSPITAL_COMMUNITY): Payer: 59

## 2015-03-29 ENCOUNTER — Other Ambulatory Visit (HOSPITAL_COMMUNITY): Payer: 59

## 2015-03-30 ENCOUNTER — Other Ambulatory Visit (HOSPITAL_COMMUNITY): Payer: 59

## 2015-04-04 ENCOUNTER — Ambulatory Visit (HOSPITAL_COMMUNITY): Payer: Self-pay | Admitting: Clinical

## 2015-04-23 ENCOUNTER — Ambulatory Visit (INDEPENDENT_AMBULATORY_CARE_PROVIDER_SITE_OTHER): Payer: PRIVATE HEALTH INSURANCE | Admitting: Clinical

## 2015-04-23 ENCOUNTER — Encounter (HOSPITAL_COMMUNITY): Payer: Self-pay | Admitting: Clinical

## 2015-04-23 DIAGNOSIS — F4321 Adjustment disorder with depressed mood: Secondary | ICD-10-CM | POA: Diagnosis not present

## 2015-04-23 DIAGNOSIS — F332 Major depressive disorder, recurrent severe without psychotic features: Secondary | ICD-10-CM | POA: Diagnosis not present

## 2015-04-23 DIAGNOSIS — F4310 Post-traumatic stress disorder, unspecified: Secondary | ICD-10-CM | POA: Diagnosis not present
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
## 2015-05-15 ENCOUNTER — Ambulatory Visit (HOSPITAL_COMMUNITY): Payer: Self-pay | Admitting: Psychiatry

## 2015-05-17 ENCOUNTER — Ambulatory Visit (HOSPITAL_COMMUNITY): Payer: Self-pay | Admitting: Clinical

## 2015-05-31 ENCOUNTER — Ambulatory Visit (HOSPITAL_COMMUNITY): Payer: Self-pay | Admitting: Clinical

## 2017-04-10 ENCOUNTER — Other Ambulatory Visit: Payer: Self-pay

## 2017-04-10 ENCOUNTER — Emergency Department (HOSPITAL_COMMUNITY): Payer: Self-pay

## 2017-04-10 ENCOUNTER — Inpatient Hospital Stay (HOSPITAL_COMMUNITY)
Admission: EM | Admit: 2017-04-10 | Discharge: 2017-04-12 | DRG: 812 | Disposition: A | Discharge status: Home / Self Care | Payer: Self-pay | Attending: Internal Medicine | Admitting: Internal Medicine

## 2017-04-10 ENCOUNTER — Encounter (HOSPITAL_COMMUNITY): Payer: Self-pay

## 2017-04-10 ENCOUNTER — Observation Stay (HOSPITAL_COMMUNITY): Payer: Self-pay

## 2017-04-10 DIAGNOSIS — Z791 Long term (current) use of non-steroidal anti-inflammatories (NSAID): Secondary | ICD-10-CM

## 2017-04-10 DIAGNOSIS — R0600 Dyspnea, unspecified: Secondary | ICD-10-CM | POA: Diagnosis present

## 2017-04-10 DIAGNOSIS — D696 Thrombocytopenia, unspecified: Secondary | ICD-10-CM | POA: Diagnosis present

## 2017-04-10 DIAGNOSIS — D649 Anemia, unspecified: Secondary | ICD-10-CM | POA: Diagnosis present

## 2017-04-10 DIAGNOSIS — Z973 Presence of spectacles and contact lenses: Secondary | ICD-10-CM

## 2017-04-10 DIAGNOSIS — Z806 Family history of leukemia: Secondary | ICD-10-CM

## 2017-04-10 DIAGNOSIS — Z91018 Allergy to other foods: Secondary | ICD-10-CM

## 2017-04-10 DIAGNOSIS — R519 Headache, unspecified: Secondary | ICD-10-CM | POA: Diagnosis present

## 2017-04-10 DIAGNOSIS — Z818 Family history of other mental and behavioral disorders: Secondary | ICD-10-CM

## 2017-04-10 DIAGNOSIS — F329 Major depressive disorder, single episode, unspecified: Secondary | ICD-10-CM | POA: Diagnosis present

## 2017-04-10 DIAGNOSIS — R0602 Shortness of breath: Secondary | ICD-10-CM

## 2017-04-10 DIAGNOSIS — F419 Anxiety disorder, unspecified: Secondary | ICD-10-CM | POA: Diagnosis present

## 2017-04-10 DIAGNOSIS — Z811 Family history of alcohol abuse and dependence: Secondary | ICD-10-CM

## 2017-04-10 DIAGNOSIS — G43909 Migraine, unspecified, not intractable, without status migrainosus: Secondary | ICD-10-CM | POA: Diagnosis present

## 2017-04-10 DIAGNOSIS — D509 Iron deficiency anemia, unspecified: Principal | ICD-10-CM | POA: Diagnosis present

## 2017-04-10 DIAGNOSIS — Z79899 Other long term (current) drug therapy: Secondary | ICD-10-CM

## 2017-04-10 DIAGNOSIS — N920 Excessive and frequent menstruation with regular cycle: Secondary | ICD-10-CM | POA: Diagnosis present

## 2017-04-10 DIAGNOSIS — R51 Headache: Secondary | ICD-10-CM

## 2017-04-10 DIAGNOSIS — E876 Hypokalemia: Secondary | ICD-10-CM | POA: Diagnosis present

## 2017-04-10 HISTORY — DX: Unspecified transfusion reaction, initial encounter: T80.92XA

## 2017-04-10 LAB — CBC WITH DIFFERENTIAL/PLATELET
BASOS PCT: 1 %
Basophils Absolute: 0 10*3/uL (ref 0.0–0.1)
EOS ABS: 0 10*3/uL (ref 0.0–0.7)
Eosinophils Relative: 1 %
HEMATOCRIT: 16.5 % — AB (ref 36.0–46.0)
Hemoglobin: 4.5 g/dL — CL (ref 12.0–15.0)
LYMPHS PCT: 32 %
Lymphs Abs: 1.5 10*3/uL (ref 0.7–4.0)
MCH: 19.9 pg — ABNORMAL LOW (ref 26.0–34.0)
MCHC: 27.3 g/dL — AB (ref 30.0–36.0)
MCV: 73 fL — AB (ref 78.0–100.0)
Monocytes Absolute: 0.4 10*3/uL (ref 0.1–1.0)
Monocytes Relative: 8 %
Neutro Abs: 2.8 10*3/uL (ref 1.7–7.7)
Neutrophils Relative %: 58 %
PLATELETS: 91 10*3/uL — AB (ref 150–400)
RBC: 2.26 MIL/uL — ABNORMAL LOW (ref 3.87–5.11)
RDW: 34.6 % — ABNORMAL HIGH (ref 11.5–15.5)
WBC: 4.7 10*3/uL (ref 4.0–10.5)

## 2017-04-10 LAB — COMPREHENSIVE METABOLIC PANEL
ALT: 13 U/L — ABNORMAL LOW (ref 14–54)
AST: 26 U/L (ref 15–41)
Albumin: 4 g/dL (ref 3.5–5.0)
Alkaline Phosphatase: 51 U/L (ref 38–126)
Anion gap: 6 (ref 5–15)
BUN: 7 mg/dL (ref 6–20)
CALCIUM: 8.7 mg/dL — AB (ref 8.9–10.3)
CO2: 24 mmol/L (ref 22–32)
Chloride: 108 mmol/L (ref 101–111)
Creatinine, Ser: 0.54 mg/dL (ref 0.44–1.00)
GFR calc non Af Amer: >60 mL/min (ref >60)
Glucose, Bld: 92 mg/dL (ref 65–99)
POTASSIUM: 3.4 mmol/L — AB (ref 3.5–5.1)
SODIUM: 138 mmol/L (ref 135–145)
Total Bilirubin: 0.5 mg/dL (ref 0.3–1.2)
Total Protein: 7.5 g/dL (ref 6.5–8.1)

## 2017-04-10 LAB — URINALYSIS, ROUTINE W REFLEX MICROSCOPIC
BILIRUBIN URINE: NEGATIVE
Glucose, UA: NEGATIVE mg/dL
HGB URINE DIPSTICK: NEGATIVE
KETONES UR: 5 mg/dL — AB
Leukocytes, UA: NEGATIVE
Nitrite: NEGATIVE
PROTEIN: NEGATIVE mg/dL
SPECIFIC GRAVITY, URINE: 1.015 (ref 1.005–1.030)
pH: 7 (ref 5.0–8.0)

## 2017-04-10 LAB — PREPARE RBC (CROSSMATCH)

## 2017-04-10 LAB — RETICULOCYTES
RBC.: 2.29 MIL/uL — ABNORMAL LOW (ref 3.87–5.11)
Retic Count, Absolute: 16 10*3/uL — ABNORMAL LOW (ref 19.0–186.0)
Retic Ct Pct: 0.7 % (ref 0.4–3.1)

## 2017-04-10 LAB — PREGNANCY, URINE: PREG TEST UR: NEGATIVE

## 2017-04-10 MED ORDER — SODIUM CHLORIDE 0.9 % IV BOLUS (SEPSIS)
1000.0000 mL | Freq: Once | INTRAVENOUS | Status: AC
Start: 2017-04-10 — End: 2017-04-10
  Administered 2017-04-10: 1000 mL via INTRAVENOUS

## 2017-04-10 MED ORDER — SUMATRIPTAN SUCCINATE 25 MG PO TABS
25.0000 mg | ORAL_TABLET | ORAL | Status: DC | PRN
  Administered 2017-04-11: 25 mg via ORAL
  Filled 2017-04-10 (×2): qty 1

## 2017-04-10 MED ORDER — POTASSIUM CHLORIDE 20 MEQ PO PACK: 40.0000 meq | PACK | ORAL | Status: DC

## 2017-04-10 MED ORDER — SODIUM CHLORIDE 0.9 % IV SOLN
Freq: Once | INTRAVENOUS | Status: AC
  Administered 2017-04-11: via INTRAVENOUS

## 2017-04-10 MED ORDER — ONDANSETRON HCL 4 MG/2ML IJ SOLN: 4.0000 mg | Freq: Four times a day (QID) | INTRAMUSCULAR | Status: DC | PRN

## 2017-04-10 MED ORDER — ACETAMINOPHEN 650 MG RE SUPP: 650.0000 mg | Freq: Four times a day (QID) | RECTAL | Status: DC | PRN

## 2017-04-10 MED ORDER — POTASSIUM CHLORIDE CRYS ER 20 MEQ PO TBCR
20.0000 meq | EXTENDED_RELEASE_TABLET | ORAL | Status: AC
  Administered 2017-04-11: 20 meq via ORAL
  Filled 2017-04-10: qty 1

## 2017-04-10 MED ORDER — ONDANSETRON HCL 4 MG PO TABS: 4.0000 mg | ORAL_TABLET | Freq: Four times a day (QID) | ORAL | Status: DC | PRN

## 2017-04-10 MED ORDER — KETOROLAC TROMETHAMINE 30 MG/ML IJ SOLN
15.0000 mg | Freq: Once | INTRAMUSCULAR | Status: AC
  Administered 2017-04-10: 15 mg via INTRAVENOUS
  Filled 2017-04-10: qty 1

## 2017-04-10 MED ORDER — ACETAMINOPHEN 325 MG PO TABS
650.0000 mg | ORAL_TABLET | Freq: Four times a day (QID) | ORAL | Status: DC | PRN
  Administered 2017-04-11: 650 mg via ORAL
  Filled 2017-04-10: qty 2

## 2017-04-10 MED ORDER — METOCLOPRAMIDE HCL 5 MG/ML IJ SOLN
10.0000 mg | Freq: Once | INTRAMUSCULAR | Status: AC
  Administered 2017-04-10: 10 mg via INTRAVENOUS
  Filled 2017-04-10: qty 2

## 2017-04-10 MED ORDER — ALBUTEROL SULFATE (2.5 MG/3ML) 0.083% IN NEBU: 2.5000 mg | INHALATION_SOLUTION | Freq: Four times a day (QID) | RESPIRATORY_TRACT | Status: DC | PRN

## 2017-04-10 MED ORDER — DIPHENHYDRAMINE HCL 25 MG PO CAPS
25.0000 mg | ORAL_CAPSULE | Freq: Once | ORAL | Status: AC
  Administered 2017-04-10: 25 mg via ORAL
  Filled 2017-04-10: qty 1

## 2017-04-10 NOTE — ED Notes (Signed)
In Ct  

## 2017-04-10 NOTE — ED Notes (Signed)
Lab called and said two units of blood is ready.

## 2017-04-10 NOTE — ED Triage Notes (Addendum)
Patient c/o "Pain from my brain to my neck, left arm and left side of body" Patient reports a history of migraines. Patient reports that she is very anxious because her father had a aneurysm and died. Patient reports sensitivity to light and sound. Patient also reports that she has increased pain when she looks to the left.

## 2017-04-10 NOTE — H&P (Signed)
History and Physical    Meghan [name] [id number] DOB: 09-28-1975 DOA: 04/10/2017  Referring MD/NP/PA: Wyn Quaker, PA-C PCP: [name], DO  Patient coming from: home  Chief Complaint: Headaches  I have personally briefly reviewed patient's old medical records in [place]   HPI: Meghan [name] is a 42 y.o. female with medical history significant of asthma, headaches, anxiety, depression, iron deficiency anemia; who presented with complaints of left-sided headache over the last 2 weeks.  Reports headache being severe with significant sensitivity to light, sound and movement.  Normally patient able to take Tylenol/Aleve with relief of symptoms, but states despite taking these medications had no relief.  Reports utilizing 2 Aleve along with BC Goody powders regularly during this time.  Associated symptoms include left-sided pain, generalized weakness, fatigue, dyspnea with exertion, shortness of breath, cough, abdominal discomfort, heartburn, significant other with "cold", and insomnia.  Denies any fever, vomiting, dysuria, loss of consciousness, calf pain, diarrhea, or reports of bleeding in her urine/stool.  Patient last completed her menstrual cycle on 1/8, but notes that she has significantly heavy cycles. Family history is positive for father having aneurysm which made her more concerned.   She used to be followed by Dr. Alvy Bimler of hematolog  for treatment of her iron deficiency as she reports having poor absorption with oral supplementation.  Treated with Feraheme IV infusions, but her last was sometime in 2015.  She notes that she moved away and has recently moved back to [place].   ED Course:  Upon admission into the emergency department patient was noted to have vital signs relatively within normal limits.  Labs revealed hemoglobin of 4.5, platelets 91, potassium 3.4.   Due to patient's complaints of significant headache CT scan of the brain was performed and  negative for any acute abnormalities.  Patient was given headache cocktail of Reglan, ketorolac, and Benadryl with improvement of symptoms.  [facility] called to admit.  Review of Systems  Constitutional: Negative for chills and fever.  HENT: Negative for ear discharge and ear pain.   Eyes: Negative for double vision and photophobia.  Respiratory: Positive for cough and shortness of breath.   Cardiovascular: Negative for chest pain and leg swelling.  Gastrointestinal: Positive for abdominal pain and heartburn. Negative for vomiting.  Genitourinary: Negative for dysuria and frequency.  Musculoskeletal: Negative for falls.  Skin: Negative for itching.  Neurological: Positive for weakness and headaches. Negative for focal weakness and loss of consciousness.  Psychiatric/Behavioral: Positive for substance abuse. The patient is nervous/anxious.     Past Medical History:  Diagnosis Date  . Anxiety   . Blood transfusion reaction   . Infection of Bartholin's gland   . 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 Leukopenia   . Menorrhagia with irregular cycle   . Moderate major depression ([place])   . Wears contact lenses     Past Surgical History:  Procedure Laterality Date  . BARTHOLIN CYST MARSUPIALIZATION N/A 04/10/2014   Procedure: INCISION AND DRAINAGE OF BARTHOLIN ABCESS ;  Surgeon: Sanjuana Kava, MD;  Location: [facility];  Service: Gynecology;  Laterality: N/A;  . CESAREAN SECTION  x3  last one 01-17-2005   last one with bilateral tubal ligation   . DILATION AND CURETTAGE OF UTERUS  1999     reports that  has never smoked. she has never used smokeless tobacco. She reports that she drinks alcohol. She reports that she uses drugs. Drug:  Marijuana.  Allergies  Allergen Reactions  . Tomato Hives    Family History  Problem Relation Age of Onset  . Alcohol abuse Father   . Depression Father   . Drug abuse Sister   . Depression  Sister   . Drug abuse Brother   . Cancer Maternal Grandmother        leukemia  . Suicidality Neg Hx     Prior to Admission medications   Medication Sig Start Date End Date Taking? Authorizing Provider  acetaminophen (TYLENOL) 500 MG tablet Take 1,000 mg by mouth every 6 (six) hours as needed for mild pain or moderate pain.   Yes [provider]  naproxen sodium (ALEVE) 220 MG tablet Take 440 mg by mouth 2 (two) times daily as needed (PAIN).   Yes [provider]  mirtazapine (REMERON) 45 MG tablet Take 1 tablet (45 mg total) by mouth at bedtime. 03/13/15 03/12/16  Charlcie Cradle, MD    Physical Exam:  Constitutional: NAD, calm, comfortable Vitals:   04/10/17 1229 04/10/17 1235 04/10/17 1856 04/10/17 2029  BP: (!) 147/80  138/73 125/63  Pulse: 76  68 68  Resp: 17  16 18   Temp: 98 F (36.7 C)     TempSrc: Oral     SpO2: 100%  100% 100%  Weight:  82.1 kg (181 lb)    Height:  5\' 7"  (1.702 m)     Eyes: PERRL, lids and conjunctivae normal ENMT: Mucous membranes are moist. Posterior pharynx clear of any exudate or lesions.Normal dentition.  Neck: normal, supple, no masses, no thyromegaly Respiratory: clear to auscultation bilaterally, no wheezing, no crackles. Normal respiratory effort. No accessory muscle use.  Cardiovascular: Regular rate and rhythm, no murmurs / rubs / gallops. No extremity edema. 2+ pedal pulses. No carotid bruits.  Abdomen: no tenderness, no masses palpated. No hepatosplenomegaly. Bowel sounds positive.  Musculoskeletal: no clubbing / cyanosis. No joint deformity upper and lower extremities. Good ROM, no contractures. Normal muscle tone.  Skin: no rashes, lesions, ulcers. No induration Neurologic: CN 2-12 grossly intact. Sensation intact, DTR normal. Strength 5/5 in all 4.  Psychiatric: Normal judgment and insight. Alert and oriented x 3. Normal mood.     Labs on Admission: I have personally reviewed following labs and imaging  studies  CBC: Recent Labs  Lab 04/10/17 1912  WBC 4.7  NEUTROABS 2.8  HGB 4.5*  HCT 16.5*  MCV 73.0*  PLT 91*   Basic Metabolic Panel: Recent Labs  Lab 04/10/17 1912  NA 138  K 3.4*  CL 108  CO2 24  GLUCOSE 92  BUN 7  CREATININE 0.54  CALCIUM 8.7*   GFR: Estimated Creatinine Clearance: 102 mL/min (by C-G formula based on SCr of 0.54 mg/dL). Liver Function Tests: Recent Labs  Lab 04/10/17 1912  AST 26  ALT 13*  ALKPHOS 51  BILITOT 0.5  PROT 7.5  ALBUMIN 4.0   No results for input(s): LIPASE, AMYLASE in the last 168 hours. No results for input(s): AMMONIA in the last 168 hours. Coagulation Profile: No results for input(s): INR, PROTIME in the last 168 hours. Cardiac Enzymes: No results for input(s): CKTOTAL, CKMB, CKMBINDEX, TROPONINI in the last 168 hours. BNP (last 3 results) No results for input(s): PROBNP in the last 8760 hours. HbA1C: No results for input(s): HGBA1C in the last 72 hours. CBG: No results for input(s): GLUCAP in the last 168 hours. Lipid Profile: No results for input(s): CHOL, HDL, LDLCALC, TRIG, CHOLHDL, LDLDIRECT in the last  72 hours. Thyroid Function Tests: No results for input(s): TSH, T4TOTAL, FREET4, T3FREE, THYROIDAB in the last 72 hours. Anemia Panel: No results for input(s): VITAMINB12, FOLATE, FERRITIN, TIBC, IRON, RETICCTPCT in the last 72 hours. Urine analysis:    Component Value Date/Time   COLORURINE YELLOW 04/10/2017 2105   APPEARANCEUR CLEAR 04/10/2017 2105   LABSPEC 1.015 04/10/2017 2105   PHURINE 7.0 04/10/2017 2105   GLUCOSEU NEGATIVE 04/10/2017 2105   HGBUR NEGATIVE 04/10/2017 2105   BILIRUBINUR NEGATIVE 04/10/2017 2105   KETONESUR 5 (A) 04/10/2017 2105   PROTEINUR NEGATIVE 04/10/2017 2105   UROBILINOGEN 0.2 11/03/2013 1534   NITRITE NEGATIVE 04/10/2017 2105   LEUKOCYTESUR NEGATIVE 04/10/2017 2105   Sepsis Labs: No results found for this or any previous visit (from the past 240 hour(s)).   Radiological  Exams on Admission: Ct Head Wo Contrast  Result Date: 04/10/2017 CLINICAL DATA:  LEFT-sided headache. EXAM: CT HEAD WITHOUT CONTRAST TECHNIQUE: Contiguous axial images were obtained from the base of the skull through the vertex without intravenous contrast. COMPARISON:  Facial CT 05/29/2003 FINDINGS: Brain: No acute intracranial hemorrhage. No focal mass lesion. No CT evidence of acute infarction. No midline shift or mass effect. No hydrocephalus. Basilar cisterns are patent. Vascular: No hyperdense vessel or unexpected calcification. Skull: Normal. Negative for fracture or focal lesion. Sinuses/Orbits: Paranasal sinuses and mastoid air cells are clear. Orbits are clear. Other: None. IMPRESSION: Normal head CT. Electronically Signed   By: Suzy Bouchard M.D.   On: 04/10/2017 20:07    Chest x-ray: Independently reviewed showing signs of cardiomegaly.  Assessment/Plan Symptomatic anemia: acute.  Patient presents with a hemoglobin of 4.5.  Last baseline hemoglobin noted to be around 10.6 in 2016.  Suspect symptoms likely from menorrhagia and iron deficiency.  However, she admits to use of r recent frequent use of Aleve and [facility] Goody, but denies having any significant dark stools.  Patient previously on Feraheme infusions followed in the outpatient setting by Dr. Alvy Bimler of hematology. - Admit to a MedSurg bed - Check anemia panel - Check stool guiac question possibility of gastric ulcer with NSAID use - Continue transfusion of 2 units of packed red blood cells - May want to notify hematology Dr. Alvy Bimler in a.m.  Headache: Improved.  Patient's headache improved after being given headache cocktail of Reglan, ketorolac, and Benadryl.  Reports frequent use of ibuprofen and Aleve over the last 2 weeks. - Sumatriptan prn  - Counseled on the need of avoidance of frequent use of NSAIDs. - Continue to monitor  Dyspnea: Acute.  Lungs otherwise sound clear.  Suspect secondary to significant anemia. - Check  chest x-ray  - Addendum: may warrant further workup of new findings of cardiomegaly found on chest x-ray could be related to anemia.  Hypokalemia: Acute.  Initial potassium 3.4. - Give 20 mEq of potassium chloride p.o. - Continue to monitor and replace as needed  Menorrhagia: Patient reports significant bleeding with menstrual cycles.  Recently reports moving back to [place]. - Likely needs to set up outpatient follow-up with OB/GYN  Thrombocytopenia: acute on chronic.  Platelet count noted to be 91 on admission.  There is note of previous history of alcohol abuse as possible cause of symptoms. - Recheck CBC in a.m. - May warrant further workup  DVT prophylaxis: SCDs Code Status: Full Family Communication: Discussed plan of care with the patient family present at bedside Disposition Plan: Likely discharge in 2-3 days  Consults called: none Admission status: [place]  MD [facility] Pager [phone]   If 7PM-7AM, please contact night-coverage [email or web address] Password [email or web address]  04/10/2017, 10:16 PM

## 2017-04-10 NOTE — ED Provider Notes (Signed)
[facility] DEPT Provider Note   CSN: [id number] Arrival date & time: 04/10/17  4268     History   Chief Complaint Chief Complaint  Patient presents with  . Headache    HPI Meghan [name] is a 42 y.o. female with a history of anxiety, depression, iron deficiency anemia, who presents today for evaluation of headache.  She reports that she has a history of migraines, however has never been evaluated by neurology.  She reports significant anxiety as when her father was 7 he had a aneurysm rupture causing him to die.  She is concerned she may have an aneurysm.  She reports that for the past month she has been having steadily worsening headaches with increased frequency and pain.  She reports that it is occasionally relieved for a few hours by Aleve or Tylenol, however then comes back.  She denies any visual changes, however does note that when she looks towards the left it causes pain on the left side of her left eye.  She reports that her headache is consistently on the left side of her head.  She also reports that over the past few days she has had pain in the left side of her neck, and left shoulder.  She denies any trauma.  She also reports that recently she has been increasingly short of breath.  She denies any chest pain.  She denies leg swelling, prior PE/DVT, no hormone use or hemoptysis.  She denies any melena, or dark tarry/sticky stools.She previously was followed by Dr. Alvy Bimler for iron deficiency anemia.      HPI  Past Medical History:  Diagnosis Date  . Anxiety   . Blood transfusion reaction   . Infection of Bartholin's gland   . 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 Leukopenia   . Menorrhagia with irregular cycle   . Moderate major depression ([place])   . Wears contact lenses     Patient Active Problem List   Diagnosis Date Noted  . Alcohol abuse 03/13/2015  . Menorrhagia 02/01/2014  .  Leukopenia 02/01/2014  . Panic disorder without agoraphobia 12/27/2013  . Cannabis abuse 08/23/2013  . Generalized anxiety disorder 08/23/2013  . MDD (major depressive disorder), recurrent episode, moderate ([place]) 08/23/2013  . Iron deficiency anemia 04/27/2013  . Pancytopenia ([place]) 04/22/2013    Past Surgical History:  Procedure Laterality Date  . BARTHOLIN CYST MARSUPIALIZATION N/A 04/10/2014   Procedure: INCISION AND DRAINAGE OF BARTHOLIN ABCESS ;  Surgeon: Sanjuana Kava, MD;  Location: [facility];  Service: Gynecology;  Laterality: N/A;  . CESAREAN SECTION  x3  last one 01-17-2005   last one with bilateral tubal ligation   . DILATION AND CURETTAGE OF UTERUS  1999    OB History    No data available       Home Medications    Prior to Admission medications   Medication Sig Start Date End Date Taking? Authorizing Provider  acetaminophen (TYLENOL) 500 MG tablet Take 1,000 mg by mouth every 6 (six) hours as needed for mild pain or moderate pain.   Yes [provider]  naproxen sodium (ALEVE) 220 MG tablet Take 440 mg by mouth 2 (two) times daily as needed (PAIN).   Yes [provider]  mirtazapine (REMERON) 45 MG tablet Take 1 tablet (45 mg total) by mouth at bedtime. 03/13/15 03/12/16  Charlcie Cradle, MD    Family History Family History  Problem Relation  Age of Onset  . Alcohol abuse Father   . Depression Father   . Drug abuse Sister   . Depression Sister   . Drug abuse Brother   . Cancer Maternal Grandmother        leukemia  . Suicidality Neg Hx     Social History Social History   Tobacco Use  . Smoking status: Never Smoker  . Smokeless tobacco: Never Used  Substance Use Topics  . Alcohol use: Yes    Comment: no alcohol in last one week- was drinking about 1/5th of liquior daily for 2 months  . Drug use: Yes    Types: Marijuana    Comment: daily     Allergies   Tomato   Review of Systems Review of Systems    Constitutional: Positive for fatigue. Negative for chills and fever.  HENT: Negative for ear pain and sore throat.   Eyes: Negative for pain and visual disturbance.  Respiratory: Positive for shortness of breath (With exercise. ). Negative for cough.   Cardiovascular: Negative for chest pain and palpitations.  Gastrointestinal: Negative for abdominal pain, diarrhea and vomiting.  Genitourinary: Negative for dysuria and hematuria.  Musculoskeletal: Positive for myalgias and neck pain. Negative for arthralgias and back pain.  Skin: Negative for color change and rash.  Neurological: Positive for light-headedness and headaches. Negative for seizures and syncope.  All other systems reviewed and are negative.    Physical Exam Updated Vital Signs BP 125/63 (BP Location: Right Arm)   Pulse 68   Temp 98 F (36.7 C) (Oral)   Resp 18   Ht 5\' 7"  (1.702 m)   Wt 82.1 kg (181 lb)   LMP 04/03/2017 (Approximate)   SpO2 100%   BMI 28.35 kg/m   Physical Exam  Constitutional: She is oriented to person, place, and time. She appears well-developed and well-nourished. She does not appear ill. No distress.  HENT:  Head: Normocephalic and atraumatic.  Eyes: Conjunctivae and EOM are normal. Pupils are equal, round, and reactive to light.  Neck: Normal range of motion. Neck supple. No JVD present. No tracheal deviation present.  Cardiovascular: Normal rate, regular rhythm and intact distal pulses.  Murmur heard. Pulmonary/Chest: Effort normal and breath sounds normal. No respiratory distress.  Abdominal: Soft. There is no tenderness.  Musculoskeletal: She exhibits no edema or deformity.  There is tenderness to palpation along left cervical paraspinal muscles.  Palpation of this area both re-creates and exacerbates her reported neck pain.  Additionally she has tenderness palpation over left superior posterior shoulder.    Lymphadenopathy:    She has no cervical adenopathy.  Neurological: She is alert  and oriented to person, place, and time.  Mental Status:  Alert, oriented, thought content appropriate, able to give a coherent history. Speech fluent without evidence of aphasia. Able to follow 2 step commands without difficulty.  Cranial Nerves:  II:  Peripheral visual fields grossly normal, pupils equal, round, reactive to light III,IV, VI: ptosis not present, extra-ocular motions intact bilaterally, pain on lateral aspect of left eye  When looking laterally.  V,VII: smile symmetric, facial light touch sensation equal VIII: hearing grossly normal to voice  X: uvula elevates symmetrically  XI: bilateral shoulder shrug symmetric and strong XII: midline tongue extension without fassiculations Motor:  Normal tone. 5/5 in upper and lower extremities bilaterally including strong and equal grip strength and dorsiflexion/plantar flexion Cerebellar: normal finger-to-nose with bilateral upper extremities Gait: normal gait and balance CV: distal pulses palpable throughout  Skin: Skin is warm and dry.  Psychiatric: She has a normal mood and affect.  Nursing note and vitals reviewed.    ED Treatments / Results  Labs (all labs ordered are listed, but only abnormal results are displayed) Labs Reviewed  URINALYSIS, ROUTINE W REFLEX MICROSCOPIC - Abnormal; Notable for the following components:      Result Value   Ketones, ur 5 (*)    All other components within normal limits  COMPREHENSIVE METABOLIC PANEL - Abnormal; Notable for the following components:   Potassium 3.4 (*)    Calcium 8.7 (*)    ALT 13 (*)    All other components within normal limits  CBC WITH DIFFERENTIAL/PLATELET - Abnormal; Notable for the following components:   RBC 2.26 (*)    Hemoglobin 4.5 (*)    HCT 16.5 (*)    MCV 73.0 (*)    MCH 19.9 (*)    MCHC 27.3 (*)    RDW 34.6 (*)    Platelets 91 (*)    All other components within normal limits  PREGNANCY, URINE  VITAMIN B12  FOLATE  IRON AND TIBC  FERRITIN    RETICULOCYTES  PREPARE RBC (CROSSMATCH)  TYPE AND SCREEN    EKG  EKG Interpretation None       Radiology Ct Head Wo Contrast  Result Date: 04/10/2017 CLINICAL DATA:  LEFT-sided headache. EXAM: CT HEAD WITHOUT CONTRAST TECHNIQUE: Contiguous axial images were obtained from the base of the skull through the vertex without intravenous contrast. COMPARISON:  Facial CT 05/29/2003 FINDINGS: Brain: No acute intracranial hemorrhage. No focal mass lesion. No CT evidence of acute infarction. No midline shift or mass effect. No hydrocephalus. Basilar cisterns are patent. Vascular: No hyperdense vessel or unexpected calcification. Skull: Normal. Negative for fracture or focal lesion. Sinuses/Orbits: Paranasal sinuses and mastoid air cells are clear. Orbits are clear. Other: None. IMPRESSION: Normal head CT. Electronically Signed   By: Suzy Bouchard M.D.   On: 04/10/2017 20:07    Procedures Procedures (including critical care time) CRITICAL CARE Performed by: Wyn Quaker Total critical care time: 39 minutes Critical care time was exclusive of separately billable procedures and treating other patients. Critical care was necessary to treat or prevent imminent or life-threatening deterioration. Critical care was time spent personally by me on the following activities: development of treatment plan with patient and/or surrogate as well as nursing, discussions with consultants, evaluation of patient's response to treatment, examination of patient, obtaining history from patient or surrogate, ordering and performing treatments and interventions, ordering and review of laboratory studies, ordering and review of radiographic studies, pulse oximetry and re-evaluation of patient's condition.  Symptomatic anemia with hemoglobin of 4.6 requiring transfusion.   Medications Ordered in ED Medications  0.9 %  sodium chloride infusion (not administered)  sodium chloride 0.9 % bolus 1,000 mL (1,000 mLs  Intravenous New Bag/Given 04/10/17 2010)  diphenhydrAMINE (BENADRYL) capsule 25 mg (25 mg Oral Given 04/10/17 2011)  ketorolac (TORADOL) 30 MG/ML injection 15 mg (15 mg Intravenous Given 04/10/17 2010)  metoCLOPramide (REGLAN) injection 10 mg (10 mg Intravenous Given 04/10/17 2010)     Initial Impression / Assessment and Plan / ED Course  I have reviewed the triage vital signs and the nursing notes.  Pertinent labs & imaging results that were available during my care of the patient were reviewed by me and considered in my medical decision making (see chart for details).    Meghan [name] presents today for evaluation of headache.  Her  headache has been gradually worsening over the past month.  Neurologically she is intact without focal deficits.  She does not appear to have recent head scans, and based on the gradually worsening nature of her headaches CT head was obtained without acute abnormalities.  She has a history of migraines, and was treated with Benadryl, Toradol, and Reglan for her pain.  Basic labs were obtained, showing significant anemia with a hemoglobin of 4.5.  Given her reported shortness of breath and headaches this represents symptomatic anemia.  Type and crossmatch was ordered along with 2 units PRBC.  When asked about her previous reaction to transfusion, she says that the only thing was her hemoglobin went to 3, denies any shortness of breath, facial swelling, or other allergic type symptoms. I spoke with Dr. Tamala Julian who agreed to admit the patient.     Final Clinical Impressions(s) / ED Diagnoses   Final diagnoses:  Acute nonintractable headache, unspecified headache type  Anemia, unspecified type    ED Discharge Orders    None       Ollen Gross 04/10/17 2229    Nat Christen, MD 04/11/17 [phone]

## 2017-04-11 ENCOUNTER — Encounter (HOSPITAL_COMMUNITY): Payer: Self-pay

## 2017-04-11 DIAGNOSIS — R519 Headache, unspecified: Secondary | ICD-10-CM | POA: Diagnosis present

## 2017-04-11 DIAGNOSIS — R0600 Dyspnea, unspecified: Secondary | ICD-10-CM | POA: Diagnosis present

## 2017-04-11 DIAGNOSIS — D649 Anemia, unspecified: Secondary | ICD-10-CM

## 2017-04-11 DIAGNOSIS — F329 Major depressive disorder, single episode, unspecified: Secondary | ICD-10-CM

## 2017-04-11 DIAGNOSIS — R51 Headache: Secondary | ICD-10-CM

## 2017-04-11 DIAGNOSIS — N921 Excessive and frequent menstruation with irregular cycle: Secondary | ICD-10-CM

## 2017-04-11 DIAGNOSIS — D696 Thrombocytopenia, unspecified: Secondary | ICD-10-CM | POA: Diagnosis present

## 2017-04-11 DIAGNOSIS — D500 Iron deficiency anemia secondary to blood loss (chronic): Secondary | ICD-10-CM

## 2017-04-11 LAB — BASIC METABOLIC PANEL
Anion gap: 3 — ABNORMAL LOW (ref 5–15)
BUN: 7 mg/dL (ref 6–20)
CHLORIDE: 111 mmol/L (ref 101–111)
CO2: 23 mmol/L (ref 22–32)
CREATININE: 0.55 mg/dL (ref 0.44–1.00)
Calcium: 8.2 mg/dL — ABNORMAL LOW (ref 8.9–10.3)
GFR calc Af Amer: >60 mL/min (ref >60)
GFR calc non Af Amer: >60 mL/min (ref >60)
GLUCOSE: 89 mg/dL (ref 65–99)
POTASSIUM: 3.9 mmol/L (ref 3.5–5.1)
SODIUM: 137 mmol/L (ref 135–145)

## 2017-04-11 LAB — IRON AND TIBC
IRON: 7 ug/dL — AB (ref 28–170)
Saturation Ratios: 2 % — ABNORMAL LOW (ref 10.4–31.8)
TIBC: 378 ug/dL (ref 250–450)
UIBC: 371 ug/dL

## 2017-04-11 LAB — CBC
HEMATOCRIT: 20.9 % — AB (ref 36.0–46.0)
Hemoglobin: 6.2 g/dL — CL (ref 12.0–15.0)
MCH: 22.8 pg — AB (ref 26.0–34.0)
MCHC: 29.7 g/dL — ABNORMAL LOW (ref 30.0–36.0)
MCV: 76.8 fL — AB (ref 78.0–100.0)
Platelets: 103 10*3/uL — ABNORMAL LOW (ref 150–400)
RBC: 2.72 MIL/uL — ABNORMAL LOW (ref 3.87–5.11)
RDW: 31.4 % — AB (ref 11.5–15.5)
WBC: 3.8 10*3/uL — ABNORMAL LOW (ref 4.0–10.5)

## 2017-04-11 LAB — VITAMIN B12: Vitamin B-12: 292 pg/mL (ref 180–914)

## 2017-04-11 LAB — OCCULT BLOOD X 1 CARD TO LAB, STOOL: Fecal Occult Bld: NEGATIVE

## 2017-04-11 LAB — FOLATE: FOLATE: 18 ng/mL (ref >5.9)

## 2017-04-11 LAB — FERRITIN: Ferritin: 2 ng/mL — ABNORMAL LOW (ref 11–307)

## 2017-04-11 LAB — PREPARE RBC (CROSSMATCH)

## 2017-04-11 MED ORDER — MIRTAZAPINE 15 MG PO TABS
45.0000 mg | ORAL_TABLET | Freq: Every day | ORAL | Status: DC
  Administered 2017-04-11: 45 mg via ORAL
  Filled 2017-04-11: qty 3

## 2017-04-11 MED ORDER — SODIUM CHLORIDE 0.9 % IV SOLN
Freq: Once | INTRAVENOUS | Status: AC
Start: 2017-04-11 — End: 2017-04-11
  Administered 2017-04-11: 16:00:00 via INTRAVENOUS

## 2017-04-11 MED ORDER — TOPIRAMATE 25 MG PO TABS
25.0000 mg | ORAL_TABLET | Freq: Every day | ORAL | Status: DC
  Administered 2017-04-11: 25 mg via ORAL
  Filled 2017-04-11: qty 1

## 2017-04-11 MED ORDER — POLYSACCHARIDE IRON COMPLEX 150 MG PO CAPS
150.0000 mg | ORAL_CAPSULE | Freq: Two times a day (BID) | ORAL | Status: DC
  Administered 2017-04-11 – 2017-04-12 (×2): 150 mg via ORAL
  Filled 2017-04-11 (×2): qty 1

## 2017-04-11 MED ORDER — FUROSEMIDE 10 MG/ML IJ SOLN
40.0000 mg | Freq: Once | INTRAMUSCULAR | Status: AC
  Administered 2017-04-11: 40 mg via INTRAVENOUS
  Filled 2017-04-11: qty 4

## 2017-04-11 NOTE — Progress Notes (Signed)
[facility] PROGRESS NOTE  Meghan [name] PPJ:[id number] DOB: 07/12/1975 DOA: 04/10/2017 PCP: [name], DO  Interim summary and history of present illness 42 year old female with a past medical history significant for depression, menorrhagia with irregular cycle, deficiency anemia and migraine; who presented to the hospital with a migraine attack and found to be symptomatically anemic as well.  Patient hemoglobin 4.5 on admission.  Assessment/Plan: 1-symptomatic iron deficiency anemia: Most likely in the setting of menorrhagia -No signs of overt bleeding currently -Despite reporting the use of NSAIDs legally she is benign nausea, vomiting, hematemesis, melena, abdominal pain, or any reflux symptoms. -After 2 units of PRBCs her hemoglobin is 6.2 this is still mildly symptomatic from her anemia. -Anemia panel confirm iron deficiency anemia (iron level of 7 with a ferritin level of 2) -Will transfuse 2 more units of PRBCs -Repeat CBC in a.m. -Start the patient on Nasonex twice a day and will benefit of outpatient follow-up with hematologist for further iron infusions and follow-up in her anemia.  2-Migraine: -Instructed about decrease in the use of NSAIDs -Continue PRN sumatriptan -Will start preventive therapy with Topamax.  3-Depression -will continue Remeron -No suicidal ideation or hallucinations.   Code Status: Full code Family Communication: Daughter at bedside Disposition Plan: Patient is still mildly symptomatic from iron deficiency anemia; hemoglobin 6.2 after transfusion.    Consultants:  None  Procedures:  Below for x-ray reports  Antibiotics:  None  HPI/Subjective: Afebrile, no chest pain, no shortness of breath.  Patient reports to have some headaches and denies any signs of acute overt bleeding.  Objective: Vitals:   04/11/17 0532 04/11/17 1338  BP: 119/65 (!) 141/77  Pulse: 73 77  Resp: 16 16  Temp: 98.1 F (36.7 C) 98.3 F (36.8 C)   SpO2: 100% 100%    Intake/Output Summary (Last 24 hours) at 04/11/2017 1343 Last data filed at 04/11/2017 0925 Gross per 24 hour  Intake 546 ml  Output -  Net 546 ml   Filed Weights   04/10/17 1235  Weight: 82.1 kg (181 lb)    Exam:   General: Afebrile, no chest pain, no shortness of breath, no nausea vomiting.  Patient reports to have ongoing headache.  Cardiovascular: S1 and S2, no rubs, no gallops, no murmur.  Respiratory: Good air movement bilaterally, no wheezing, no crackles, no use of accessory muscles.  Good O2 sat on room air.  Abdomen: Soft, nontender, nondistended, positive bowel sounds.  Musculoskeletal: No edema, no cyanosis or clubbing.  Data Reviewed: Basic Metabolic Panel: Recent Labs  Lab 04/10/17 1912 04/11/17 0905  NA 138 137  K 3.4* 3.9  CL 108 111  CO2 24 23  GLUCOSE 92 89  BUN 7 7  CREATININE 0.54 0.55  CALCIUM 8.7* 8.2*   Liver Function Tests: Recent Labs  Lab 04/10/17 1912  AST 26  ALT 13*  ALKPHOS 51  BILITOT 0.5  PROT 7.5  ALBUMIN 4.0   CBC: Recent Labs  Lab 04/10/17 1912 04/11/17 0905  WBC 4.7 3.8*  NEUTROABS 2.8  --   HGB 4.5* 6.2*  HCT 16.5* 20.9*  MCV 73.0* 76.8*  PLT 91* 103*   Studies: Ct Head Wo Contrast  Result Date: 04/10/2017 CLINICAL DATA:  LEFT-sided headache. EXAM: CT HEAD WITHOUT CONTRAST TECHNIQUE: Contiguous axial images were obtained from the base of the skull through the vertex without intravenous contrast. COMPARISON:  Facial CT 05/29/2003 FINDINGS: Brain: No acute intracranial hemorrhage. No focal mass lesion. No CT evidence of acute infarction.  No midline shift or mass effect. No hydrocephalus. Basilar cisterns are patent. Vascular: No hyperdense vessel or unexpected calcification. Skull: Normal. Negative for fracture or focal lesion. Sinuses/Orbits: Paranasal sinuses and mastoid air cells are clear. Orbits are clear. Other: None. IMPRESSION: Normal head CT. Electronically Signed   By: Suzy Bouchard  M.D.   On: 04/10/2017 20:07   Dg Chest Port 1 View  Result Date: 04/10/2017 CLINICAL DATA:  Shortness of breath with headache EXAM: PORTABLE CHEST 1 VIEW COMPARISON:  01/04/2013 FINDINGS: Mild cardiomegaly.  No consolidation or effusion.  No pneumothorax. IMPRESSION: No active disease.  Mild cardiomegaly. Electronically Signed   By: Donavan Foil M.D.   On: 04/10/2017 23:02    Scheduled Meds: . furosemide  40 mg Intravenous Once  . topiramate  25 mg Oral QHS   Continuous Infusions: . sodium chloride      Principal Problem:   Symptomatic anemia Active Problems:   Menorrhagia   Headache   Dyspnea   Thrombocytopenia ([place])    Time spent: 30 minutes    [facility] Pager [phone]. If 7PM-7AM, please contact night-coverage at [email or web address], password [facility] 04/11/2017, 1:43 PM  LOS: 0 days

## 2017-04-12 DIAGNOSIS — R0602 Shortness of breath: Secondary | ICD-10-CM

## 2017-04-12 LAB — BPAM RBC
BLOOD PRODUCT EXPIRATION DATE: 201902162359
BLOOD PRODUCT EXPIRATION DATE: 201902172359
Blood Product Expiration Date: 201902172359
Blood Product Expiration Date: 201902172359
ISSUE DATE / TIME: 201901182346
ISSUE DATE / TIME: 201901190233
ISSUE DATE / TIME: 201901191548
ISSUE DATE / TIME: 201901191934
UNIT TYPE AND RH: 5100
Unit Type and Rh: 5100
Unit Type and Rh: 5100
Unit Type and Rh: 5100

## 2017-04-12 LAB — TYPE AND SCREEN
ABO/RH(D): O POS
Antibody Screen: NEGATIVE
UNIT DIVISION: 0
UNIT DIVISION: 0
Unit division: 0
Unit division: 0

## 2017-04-12 LAB — CBC
HCT: 28 % — ABNORMAL LOW (ref 36.0–46.0)
HEMOGLOBIN: 8.6 g/dL — AB (ref 12.0–15.0)
MCH: 23.9 pg — AB (ref 26.0–34.0)
MCHC: 30.7 g/dL (ref 30.0–36.0)
MCV: 77.8 fL — ABNORMAL LOW (ref 78.0–100.0)
Platelets: 177 10*3/uL (ref 150–400)
RBC: 3.6 MIL/uL — AB (ref 3.87–5.11)
RDW: 28.5 % — ABNORMAL HIGH (ref 11.5–15.5)
WBC: 4.5 10*3/uL (ref 4.0–10.5)

## 2017-04-12 LAB — HIV ANTIBODY (ROUTINE TESTING W REFLEX): HIV Screen 4th Generation wRfx: NONREACTIVE

## 2017-04-12 MED ORDER — POLYSACCHARIDE IRON COMPLEX 150 MG PO CAPS: 150.0000 mg | ORAL_CAPSULE | Freq: Two times a day (BID) | ORAL | 1 refills | Status: DC

## 2017-04-12 MED ORDER — TOPIRAMATE 25 MG PO TABS: ORAL_TABLET | ORAL | 1 refills | Status: DC

## 2017-04-12 MED ORDER — SUMATRIPTAN SUCCINATE 25 MG PO TABS: 25.0000 mg | ORAL_TABLET | Freq: Four times a day (QID) | ORAL | 0 refills | Status: DC | PRN

## 2017-04-12 NOTE — Progress Notes (Signed)
Pt leaving this afternoon with her children. Alert, oriented, and without c/o. Discharge instructions/prescription given/explained with pt verbalizing understanding.  Pt aware to followup with PCP and Hematologist.

## 2017-04-12 NOTE — Care Management Note (Signed)
Case Management Note  Patient Details  Name: RENADA CRONIN MRN: [id number] Date of Birth: [birth year]  Subjective/Objective:    Symptomatic anemia                Action/Plan: Spoke to pt and states she has insurance. She does not have insurance card. Provided pt with goodrx coupons and information on [facility] and [facility]. Explained she will need to follow up with financial office with info on insurance.   Expected Discharge Date:  04/12/17               Expected Discharge Plan:  Home/Self Care  In-House Referral:  NA  Discharge planning Services  CM Consult, [facility], Medication Assistance  Post Acute Care Choice:  NA Choice offered to:  NA  DME Arranged:  N/A DME Agency:  NA  HH Arranged:  NA HH Agency:  NA  Status of Service:  Completed, signed off  If discussed at [place] of Stay Meetings, dates discussed:    Additional Comments:  Erenest Rasher, RN 04/12/2017, 11:17 AM

## 2017-04-12 NOTE — Discharge Summary (Signed)
Physician Discharge Summary  Meghan [name] JSE:[id number] DOB: Oct 05, 1975 DOA: 04/10/2017  PCP: [name], DO  Admit date: 04/10/2017 Discharge date: 04/12/2017  Time spent: 35 minutes  Recommendations for Outpatient Follow-up:  1. Repeat CBC to follow Hgb trend 2. Follow resolution of her Migraine and if needed further adjust preventive therapy (Topamax).   Discharge Diagnoses:  Principal Problem:   Symptomatic anemia Active Problems:   Menorrhagia   Headache   Dyspnea   Thrombocytopenia ([facility])   Discharge Condition: stable and improved. Discharge home with instructions to follow up with PCP in 10 days and with hematologist in 3 weeks.   Diet recommendation: regular diet (with increased fiber to prevent constipation while using iron therapy)  Filed Weights   04/10/17 1235  Weight: 82.1 kg (181 lb)    Brief History of present illness:  42 year old female with a past medical history significant for depression, menorrhagia with irregular cycle, deficiency anemia and migraine; who presented to the hospital with a migraine attack and found to be symptomatically anemic as well.  Patient hemoglobin 4.5 on admission.  Hospital Course:  1-symptomatic iron deficiency anemia: Most likely in the setting of menorrhagia -No signs of overt bleeding appreciated or reported.  -Despite reporting the use of NSAIDs, she is denying nausea, vomiting, hematemesis, melena, abdominal pain, or any reflux symptoms. -After 4 units of PRBC's during this admission, her Hgb level was 8.6 and patient was asymptomatic.  -Anemia panel confirm iron deficiency anemia (iron level of 7 with a ferritin level of 2) -Repeat CBC at follow up visit  -Start the patient on Niferex twice a day and will benefit of outpatient follow-up with hematologist for further iron infusion therapy and follow-up in her anemia.  2-Migraine: -Instructed about decrease in the use of NSAIDs -Continue PRN sumatriptan at  discharge for abortive therapy. -Will start preventive therapy with Topamax. -no HA's at discharge  3-Depression -will continue Remeron -No suicidal ideation or hallucinations. -stable mood.   Procedures:  See below for x-ray reports   Consultations:  None   Discharge Exam: Vitals:   04/11/17 2205 04/12/17 0538  BP: 130/70 132/69  Pulse: 76 68  Resp: 18 15  Temp: 98 F (36.7 C) 98.4 F (36.9 C)  SpO2: 100% 100%     General: Afebrile, no chest pain, no shortness of breath, no nausea vomiting.  Patient reports no HA's at discharge and is feeling good.   Cardiovascular: S1 and S2, no rubs, no gallops, no murmur.  Respiratory: Good air movement bilaterally, no wheezing, no crackles, no use of accessory muscles.  Good O2 sat on room air.  Abdomen: Soft, nontender, nondistended, positive bowel sounds.  Musculoskeletal: No edema, no cyanosis or clubbing.     Discharge Instructions   Discharge Instructions    Discharge instructions   Complete by:  As directed    Keep yourself well hydrated  Take medications as prescribed  Arrange follow up with PCP in 10 days Arrange follow up with Dr. Alvy Bimler at [facility], for further follow up on your anemia and iron infusion therapy.     Allergies as of 04/12/2017      Reactions   Tomato Hives      Medication List    STOP taking these medications   naproxen sodium 220 MG tablet Commonly known as:  ALEVE     TAKE these medications   acetaminophen 500 MG tablet Commonly known as:  TYLENOL Take 1,000 mg by mouth every 6 (six) hours as  needed for mild pain or moderate pain.   iron polysaccharides 150 MG capsule Commonly known as:  NIFEREX Take 1 capsule (150 mg total) by mouth 2 (two) times daily.   mirtazapine 45 MG tablet Commonly known as:  REMERON Take 1 tablet (45 mg total) by mouth at bedtime.   SUMAtriptan 25 MG tablet Commonly known as:  IMITREX Take 1 tablet (25 mg total) by mouth every 6 (six)  hours as needed for migraine or headache. May repeat in 2 hours if headache persists or recurs.   topiramate 25 MG tablet Commonly known as:  TOPAMAX Take 25 mg daily for 1 week; then changed to 25 mg BID.      Allergies  Allergen Reactions  . Tomato Hives   Follow-up Information    [name], DO. Schedule an appointment as soon as possible for a visit in 10 day(s).   Specialty:  Obstetrics and Gynecology Contact information: [address] [place] [address] [phone]        Heath Lark, MD. Schedule an appointment as soon as possible for a visit in 3 week(s).   Specialty:  Hematology and Oncology Contact information: [place] [address] [phone]            The results of significant diagnostics from this hospitalization (including imaging, microbiology, ancillary and laboratory) are listed below for reference.    Significant Diagnostic Studies: Ct Head Wo Contrast  Result Date: 04/10/2017 CLINICAL DATA:  LEFT-sided headache. EXAM: CT HEAD WITHOUT CONTRAST TECHNIQUE: Contiguous axial images were obtained from the base of the skull through the vertex without intravenous contrast. COMPARISON:  Facial CT 05/29/2003 FINDINGS: Brain: No acute intracranial hemorrhage. No focal mass lesion. No CT evidence of acute infarction. No midline shift or mass effect. No hydrocephalus. Basilar cisterns are patent. Vascular: No hyperdense vessel or unexpected calcification. Skull: Normal. Negative for fracture or focal lesion. Sinuses/Orbits: Paranasal sinuses and mastoid air cells are clear. Orbits are clear. Other: None. IMPRESSION: Normal head CT. Electronically Signed   By: Suzy Bouchard M.D.   On: 04/10/2017 20:07   Dg Chest Port 1 View  Result Date: 04/10/2017 CLINICAL DATA:  Shortness of breath with headache EXAM: PORTABLE CHEST 1 VIEW COMPARISON:  01/04/2013 FINDINGS: Mild cardiomegaly.  No consolidation or  effusion.  No pneumothorax. IMPRESSION: No active disease.  Mild cardiomegaly. Electronically Signed   By: Donavan Foil M.D.   On: 04/10/2017 23:02   Labs: Basic Metabolic Panel: Recent Labs  Lab 04/10/17 1912 04/11/17 0905  NA 138 137  K 3.4* 3.9  CL 108 111  CO2 24 23  GLUCOSE 92 89  BUN 7 7  CREATININE 0.54 0.55  CALCIUM 8.7* 8.2*   Liver Function Tests: Recent Labs  Lab 04/10/17 1912  AST 26  ALT 13*  ALKPHOS 51  BILITOT 0.5  PROT 7.5  ALBUMIN 4.0   CBC: Recent Labs  Lab 04/10/17 1912 04/11/17 0905 04/12/17 0610  WBC 4.7 3.8* 4.5  NEUTROABS 2.8  --   --   HGB 4.5* 6.2* 8.6*  HCT 16.5* 20.9* 28.0*  MCV 73.0* 76.8* 77.8*  PLT 91* 103* 177    Signed:  Barton Dubois MD.  [facility] 04/12/2017, 10:16 AM

## 2018-08-04 ENCOUNTER — Other Ambulatory Visit: Payer: Self-pay | Admitting: Obstetrics & Gynecology

## 2018-08-04 ENCOUNTER — Observation Stay (HOSPITAL_COMMUNITY)
Admission: AD | Admit: 2018-08-04 | Discharge: 2018-08-05 | Disposition: A | Discharge status: Home / Self Care | Payer: Managed Care, Other (non HMO) | Source: Ambulatory Visit | Attending: Obstetrics & Gynecology | Admitting: Obstetrics & Gynecology

## 2018-08-04 DIAGNOSIS — Z79899 Other long term (current) drug therapy: Secondary | ICD-10-CM | POA: Insufficient documentation

## 2018-08-04 DIAGNOSIS — N920 Excessive and frequent menstruation with regular cycle: Secondary | ICD-10-CM | POA: Diagnosis not present

## 2018-08-04 DIAGNOSIS — F419 Anxiety disorder, unspecified: Secondary | ICD-10-CM | POA: Insufficient documentation

## 2018-08-04 DIAGNOSIS — D649 Anemia, unspecified: Secondary | ICD-10-CM | POA: Diagnosis present

## 2018-08-04 LAB — CBC
HCT: 26.8 % — ABNORMAL LOW (ref 36.0–46.0)
Hemoglobin: 7.1 g/dL — ABNORMAL LOW (ref 12.0–15.0)
MCH: 18.5 pg — ABNORMAL LOW (ref 26.0–34.0)
MCHC: 26.5 g/dL — ABNORMAL LOW (ref 30.0–36.0)
MCV: 69.8 fL — ABNORMAL LOW (ref 80.0–100.0)
Platelets: 375 10*3/uL (ref 150–400)
RBC: 3.84 MIL/uL — ABNORMAL LOW (ref 3.87–5.11)
RDW: 31.4 % — ABNORMAL HIGH (ref 11.5–15.5)
WBC: 4.4 10*3/uL (ref 4.0–10.5)
nRBC: 0 % (ref 0.0–0.2)

## 2018-08-04 LAB — ABO/RH: ABO/RH(D): O POS

## 2018-08-04 LAB — PREPARE RBC (CROSSMATCH)

## 2018-08-04 MED ORDER — SODIUM CHLORIDE 0.9% IV SOLUTION
Freq: Once | INTRAVENOUS | Status: AC
  Administered 2018-08-04: 20:00:00 via INTRAVENOUS

## 2018-08-04 MED ORDER — DIPHENHYDRAMINE HCL 25 MG PO CAPS
25.0000 mg | ORAL_CAPSULE | Freq: Once | ORAL | Status: AC
  Administered 2018-08-04: 20:00:00 25 mg via ORAL
  Filled 2018-08-04: qty 1

## 2018-08-04 MED ORDER — LACTATED RINGERS IV SOLN
Freq: Once | INTRAVENOUS | Status: AC
  Administered 2018-08-05: via INTRAVENOUS

## 2018-08-04 MED ORDER — ACETAMINOPHEN 325 MG PO TABS
650.0000 mg | ORAL_TABLET | Freq: Once | ORAL | Status: AC
  Administered 2018-08-04: 20:00:00 650 mg via ORAL
  Filled 2018-08-04: qty 2

## 2018-08-04 NOTE — Progress Notes (Signed)
Blood bank called this RN discussed pt's recent hemoglobin. Hgb level is 7.1 and Leslie from blood bank said that they can't release blood if pt's hgb is more than 7. Called Dr. Alwyn Pea to made aware.

## 2018-08-04 NOTE — Progress Notes (Signed)
Blood bank called back that they have 1 unit of blood ready for the patient.

## 2018-08-05 DIAGNOSIS — D649 Anemia, unspecified: Secondary | ICD-10-CM | POA: Diagnosis not present

## 2018-08-05 LAB — HEMOGLOBIN AND HEMATOCRIT, BLOOD
HCT: 25.1 % — ABNORMAL LOW (ref 36.0–46.0)
HCT: 30.9 % — ABNORMAL LOW (ref 36.0–46.0)
Hemoglobin: 7.1 g/dL — ABNORMAL LOW (ref 12.0–15.0)
Hemoglobin: 9 g/dL — ABNORMAL LOW (ref 12.0–15.0)

## 2018-08-05 MED ORDER — FUSION PLUS PO CAPS: 1.0000 | ORAL_CAPSULE | Freq: Two times a day (BID) | ORAL | 4 refills | Status: DC

## 2018-08-05 NOTE — Progress Notes (Signed)
Pt discharge to home. AVS reviewed with patient, follow up appts reviewed with pt. No questions at this time. Pt transported off unit by volunteer.

## 2018-08-05 NOTE — H&P (Signed)
Meghan [name] is an 43 y.o. female admitted to hospital for symptomatic anemia.  She reports feeling very weak and light headed. She is also dizzy.  Patient with a history of abnormal uterine bleeding with passing of large clots and flooding.  She has to wear dual protection and requires frequent changing of pads and tampons.  She denies dysmenorrhea and dyspareunia.  She uses a BTL for contraception.   Pertinent Gynecological History: Menses: flow is excessive with use of 7 pads or tampons on heaviest days Bleeding: dysfunctional uterine bleeding Contraception: tubal ligation DES exposure: denies Blood transfusions: yes in the past Sexually transmitted diseases: no past history Previous GYN Procedures: DNC  Last mammogram: abnormal: right breast density needing further views Date: 07/2018 Last pap: normal Date: 07/2018 OB History: G3, P3   Menstrual History: Menarche age: 64 No LMP recorded.    Past Medical History:  Diagnosis Date  . Anxiety   . Blood transfusion reaction   . Infection of Bartholin's gland   . 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 Leukopenia   . Menorrhagia with irregular cycle   . Moderate major depression ([place])   . Wears contact lenses     Past Surgical History:  Procedure Laterality Date  . BARTHOLIN CYST MARSUPIALIZATION N/A 04/10/2014   Procedure: INCISION AND DRAINAGE OF BARTHOLIN ABCESS ;  Surgeon: Sanjuana Kava, MD;  Location: [facility];  Service: Gynecology;  Laterality: N/A;  . CESAREAN SECTION  x3  last one 01-17-2005   last one with bilateral tubal ligation   . DILATION AND CURETTAGE OF UTERUS  1999    Family History  Problem Relation Age of Onset  . Alcohol abuse Father   . Depression Father   . Drug abuse Sister   . Depression Sister   . Drug abuse Brother   . Cancer Maternal Grandmother        leukemia  . Suicidality Neg Hx     Social History:  reports that she has never  smoked. She has never used smokeless tobacco. She reports current alcohol use. She reports current drug use. Drug: Marijuana.  Allergies:  Allergies  Allergen Reactions  . Tomato Hives    Medications Prior to Admission  Medication Sig Dispense Refill Last Dose  . acetaminophen (TYLENOL) 500 MG tablet Take 1,000 mg by mouth every 6 (six) hours as needed for mild pain or moderate pain.   unk  . guaiFENesin (MUCINEX) 600 MG 12 hr tablet Take 600 mg by mouth 2 (two) times daily as needed for cough or to loosen phlegm.   08/02/2018  . iron polysaccharides (NIFEREX) 150 MG capsule Take 1 capsule (150 mg total) by mouth 2 (two) times daily. (Patient not taking: Reported on 08/05/2018) 60 capsule 1 Not Taking at Unknown time  . mirtazapine (REMERON) 45 MG tablet Take 1 tablet (45 mg total) by mouth at bedtime. 30 tablet 1 Taking  . SUMAtriptan (IMITREX) 25 MG tablet Take 1 tablet (25 mg total) by mouth every 6 (six) hours as needed for migraine or headache. May repeat in 2 hours if headache persists or recurs. (Patient not taking: Reported on 08/05/2018) 30 tablet 0 Not Taking at Unknown time  . topiramate (TOPAMAX) 25 MG tablet Take 25 mg daily for 1 week; then changed to 25 mg BID. (Patient not taking: Reported on 08/05/2018) 60 tablet 1 Not Taking at Unknown time    Review of Systems  Neurological: Positive  for dizziness, weakness and headaches.  All other systems reviewed and are negative.   Blood pressure 131/71, pulse 61, temperature 98.6 F (37 C), temperature source Oral, resp. rate 16, height 5\' 7"  (1.702 m), weight 79.7 kg, SpO2 100 %. Physical Exam  Nursing note and vitals reviewed. Constitutional: She is oriented to person, place, and time. She appears well-developed and well-nourished.  Genitourinary:    Genitourinary Comments: deferred   Neurological: She is alert and oriented to person, place, and time. She has normal reflexes.  Skin: Skin is dry. There is pallor.    Results for  orders placed or performed during the hospital encounter of 08/04/18 (from the past 24 hour(s))  Type and screen     Status: None (Preliminary result)   Collection Time: 08/04/18  6:13 PM  Result Value Ref Range   ABO/RH(D) O POS    Antibody Screen NEG    Sample Expiration 08/07/2018,2359    Unit Number [id number]    Blood Component Type RED CELLS,LR    Unit division 00    Status of Unit ISSUED,FINAL    Transfusion Status OK TO TRANSFUSE    Crossmatch Result      Compatible Performed at [facility], [address], [place], [place] [address]    Unit Number [id number]    Blood Component Type RED CELLS,LR    Unit division 00    Status of Unit ISSUED    Transfusion Status OK TO TRANSFUSE    Crossmatch Result Compatible   Prepare RBC     Status: None   Collection Time: 08/04/18  6:13 PM  Result Value Ref Range   Order Confirmation      ORDER PROCESSED BY BLOOD BANK Performed at [facility], [place] [address], [place], [place] [address]   CBC     Status: Abnormal   Collection Time: 08/04/18  6:13 PM  Result Value Ref Range   WBC 4.4 4.0 - 10.5 K/uL   RBC 3.84 (L) 3.87 - 5.11 MIL/uL   Hemoglobin 7.1 (L) 12.0 - 15.0 g/dL   HCT 26.8 (L) 36.0 - 46.0 %   MCV 69.8 (L) 80.0 - 100.0 fL   MCH 18.5 (L) 26.0 - 34.0 pg   MCHC 26.5 (L) 30.0 - 36.0 g/dL   RDW 31.4 (H) 11.5 - 15.5 %   Platelets 375 150 - 400 K/uL   nRBC 0.0 0.0 - 0.2 %  ABO/Rh     Status: None   Collection Time: 08/04/18  6:13 PM  Result Value Ref Range   ABO/RH(D)      O POS Performed at [facility] [address] [place], [place] [address]   Hemoglobin and hematocrit, blood     Status: Abnormal   Collection Time: 08/05/18  2:09 AM  Result Value Ref Range   Hemoglobin 7.1 (L) 12.0 - 15.0 g/dL   HCT 25.1 (L) 36.0 - 46.0 %  Hemoglobin and hematocrit, blood     Status: Abnormal   Collection Time: 08/05/18 10:00 AM  Result Value Ref Range   Hemoglobin 9.0 (L) 12.0 - 15.0 g/dL   HCT 30.9  (L) 36.0 - 46.0 %    No results found.  Assessment/Plan: 43 year old with h/o abnormal uterine bleeding / menorrhagia, now with symptomatic anemia CBC from office 6.5 today in hospital 7.1.  Admit to GYN floor for blood transfusion (type and cross match 2 units) Will check post transfusion CBC.  Patient scheduled  for pelvic US and endometrial biopsy in office next week.    [place], [place] 08/05/2018, 12:38 PM

## 2018-08-05 NOTE — Progress Notes (Signed)
Subjective: Patient reports feeling "a little" better. She still feels fatigued. The dizziness and lightheadedness Has resolved.  She denies chest pain or shortness of breath.     Objective: I have reviewed patient's vital signs, intake and output and labs.  General: alert, cooperative, appears stated age and no distress Extremities: extremities normal, atraumatic, no cyanosis or edema, Homans sign is negative, no sign of DVT and SCDs in place   Assessment/Plan: 43 year old with h/o severe symptomatic anemia post 2 units of packed red blood cells.  Hemoglobin now 9 after 2 units.  Will discharge patient home today with PO iron.  She will f/u in office Monday.    LOS: 0 days    Derrik Mceachern, [place] 08/05/2018, 12:45 PM

## 2018-08-05 NOTE — Discharge Summary (Signed)
Physician Discharge Summary  Patient ID: Meghan [name] MRN: [id number] DOB/AGE: March 31, 1975 43 y.o.  Admit date: 08/04/2018 Discharge date: 08/05/2018  Admission Diagnoses:  1. Severe symptomatic anemia 2. Abnormal uterine bleeding  Discharge Diagnoses:  Same as admission diagnoses   Discharged Condition: good  Hospital Course: patient admitted for 23 hour observation on GYN floor for transfusion of 2 units of packed red blood cells due to symptomatic anemia.  Patient received both units after premedication with benadryl and tylenol.  She tolerated the transfusion well w/o any complication. Patient with some improvement in symptoms.  She was discharged home the following day with po iron to be taken twice daily and close follow up in the office.   Consults: None  Significant Diagnostic Studies: labs: pre transfusion hb: 7.1 post transfusion: hb 9  Treatments: IV hydration and transfusion 2 units PRBCs  Discharge Exam: Blood pressure 131/71, pulse 61, temperature 98.6 F (37 C), temperature source Oral, resp. rate 16, height 5\' 7"  (1.702 m), weight 79.7 kg, SpO2 100 %. General appearance: alert, cooperative and no distress Pelvic: deferred Extremities: extremities normal, atraumatic, no cyanosis or edema, Homans sign is negative, no sign of DVT and SCDs in place  Disposition: Discharge disposition: 01-Home or Self Care       Discharge Instructions    Call MD for:   Complete by:  As directed    Heavy vaginal bleeding - soaking a pad an hour   Call MD for:  difficulty breathing, headache or visual disturbances   Complete by:  As directed    Call MD for:  extreme fatigue   Complete by:  As directed    Call MD for:  hives   Complete by:  As directed    Call MD for:  persistant dizziness or light-headedness   Complete by:  As directed    Call MD for:  persistant nausea and vomiting   Complete by:  As directed    Call MD for:  severe uncontrolled pain   Complete by:  As  directed    Call MD for:  temperature >100.4   Complete by:  As directed    Diet - low sodium heart healthy   Complete by:  As directed    Discharge instructions   Complete by:  As directed    Call the office [phone] if you have any questions or concerns.  Follow up appointment in office this Monday with Dr. Alwyn Pea   Driving Restrictions   Complete by:  As directed    Don't drive if you feel light headed or dizzy   Increase activity slowly   Complete by:  As directed      Allergies as of 08/05/2018      Reactions   Tomato Hives      Medication List    STOP taking these medications   iron polysaccharides 150 MG capsule Commonly known as:  NIFEREX     TAKE these medications   acetaminophen 500 MG tablet Commonly known as:  TYLENOL Take 1,000 mg by mouth every 6 (six) hours as needed for mild pain or moderate pain.   Fusion Plus Caps Take 1 capsule by mouth 2 (two) times a day.   guaiFENesin 600 MG 12 hr tablet Commonly known as:  MUCINEX Take 600 mg by mouth 2 (two) times daily as needed for cough or to loosen phlegm.   mirtazapine 45 MG tablet Commonly known as:  Remeron Take 1 tablet (45 mg total) by mouth  at bedtime.   SUMAtriptan 25 MG tablet Commonly known as:  IMITREX Take 1 tablet (25 mg total) by mouth every 6 (six) hours as needed for migraine or headache. May repeat in 2 hours if headache persists or recurs.   topiramate 25 MG tablet Commonly known as:  TOPAMAX Take 25 mg daily for 1 week; then changed to 25 mg BID.      Follow-up Information    [facility] Call.   Why:  Call this number to find a [facility] doctor who is accepting your insurance, and to schedule a follow up appointment  Contact information: [phone]          Signed: Caffie Damme 08/05/2018, 12:53 PM

## 2018-08-06 LAB — BPAM RBC
Blood Product Expiration Date: 202005262359
Blood Product Expiration Date: 202006022359
ISSUE DATE / TIME: 202005132024
ISSUE DATE / TIME: 202005140528
Unit Type and Rh: 5100
Unit Type and Rh: 5100

## 2018-08-06 LAB — TYPE AND SCREEN
ABO/RH(D): O POS
Antibody Screen: NEGATIVE
Unit division: 0
Unit division: 0

## 2018-08-23 ENCOUNTER — Telehealth: Payer: Self-pay | Admitting: Oncology

## 2018-08-23 NOTE — Telephone Encounter (Signed)
A new hem appt has been scheduled for the pt to see Dr. Alen Blew on 6/3 at 11am. Pt aware to arrive 15 minutes early.

## 2018-08-25 ENCOUNTER — Other Ambulatory Visit: Payer: Self-pay

## 2018-08-25 ENCOUNTER — Inpatient Hospital Stay: Payer: Managed Care, Other (non HMO) | Attending: Oncology | Admitting: Oncology

## 2018-08-25 VITALS — BP 129/61 | HR 74 | Temp 98.9°F | Resp 17 | Ht 67.0 in | Wt 183.4 lb

## 2018-08-25 DIAGNOSIS — D509 Iron deficiency anemia, unspecified: Secondary | ICD-10-CM | POA: Diagnosis present

## 2018-08-25 DIAGNOSIS — M791 Myalgia, unspecified site: Secondary | ICD-10-CM | POA: Diagnosis not present

## 2018-08-25 DIAGNOSIS — D751 Secondary polycythemia: Secondary | ICD-10-CM | POA: Diagnosis not present

## 2018-08-25 DIAGNOSIS — D508 Other iron deficiency anemias: Secondary | ICD-10-CM

## 2018-08-25 DIAGNOSIS — F419 Anxiety disorder, unspecified: Secondary | ICD-10-CM | POA: Insufficient documentation

## 2018-08-25 DIAGNOSIS — N920 Excessive and frequent menstruation with regular cycle: Secondary | ICD-10-CM | POA: Insufficient documentation

## 2018-08-25 DIAGNOSIS — R5383 Other fatigue: Secondary | ICD-10-CM

## 2018-08-25 DIAGNOSIS — Z79899 Other long term (current) drug therapy: Secondary | ICD-10-CM | POA: Insufficient documentation

## 2018-08-25 DIAGNOSIS — K5900 Constipation, unspecified: Secondary | ICD-10-CM | POA: Insufficient documentation

## 2018-08-25 DIAGNOSIS — R1013 Epigastric pain: Secondary | ICD-10-CM | POA: Insufficient documentation

## 2018-08-25 DIAGNOSIS — Z806 Family history of leukemia: Secondary | ICD-10-CM

## 2018-08-25 DIAGNOSIS — R0602 Shortness of breath: Secondary | ICD-10-CM | POA: Diagnosis not present

## 2018-08-25 DIAGNOSIS — I10 Essential (primary) hypertension: Secondary | ICD-10-CM | POA: Insufficient documentation

## 2018-08-25 NOTE — Progress Notes (Signed)
Reason for the request: Anemia and thrombocytosis.  HPI: I was asked by Dr. Alwyn Pea to evaluate Ms. Reeves for the evaluation of anemia.  She is a 43 year old woman with history of iron deficiency anemia that dates back to at least 2015.  At that time she was noted to have iron of 14 and ferritin of 4 and was evaluated by Dr. Alvy Bimler and received intravenous iron at that time. Her Iron deficiency was presumed to be related to menorrhagia and chronic menstrual blood losses.  She reports in the last few months she had 2 hospitalizations because of worsening anemia and menorrhagia.  In January 2020 repeat iron studies showed iron level of 7 and ferritin of 2.  At that time she presented with a hemoglobin of 4.5 and required 4 units of packed red cells.  She was transfused again in May 2020 after presenting with a hemoglobin of 7.1 and she was transfused up to a hemoglobin of 9.0.  Since January 2020 she was started on oral iron although she has not been taking regularly and has been symptomatic from it.  She reports nausea, dyspepsia and constipation.  Currently, she does report some fatigue and some mild dyspnea on exertion.  She denied any chest pain, palpitation, hematochezia or melena.  She had a CBC done on Aug 03, 2018 which showed a hemoglobin of 6.9 and a platelet count of 1026.  This prompted her recent transfusion of 2 units on Aug 04, 2018.  She does not report any headaches, blurry vision, syncope or seizures. Does not report any fevers, chills or sweats.  Does not report any cough, wheezing or hemoptysis.  Does not report any chest pain, palpitation, orthopnea or leg edema.  Does not report any nausea, vomiting or abdominal pain.  Does not report any constipation or diarrhea.  Does not report any skeletal complaints.    Does not report frequency, urgency or hematuria.  Does not report any skin rashes or lesions. Does not report any heat or cold intolerance.  Does not report any lymphadenopathy or  petechiae.  Does not report any anxiety or depression.  Remaining review of systems is negative.    Past Medical History:  Diagnosis Date  . Anxiety   . Blood transfusion reaction   . Infection of Bartholin's gland   . 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 Leukopenia   . Menorrhagia with irregular cycle   . Moderate major depression ([place])   . Wears contact lenses   :  Past Surgical History:  Procedure Laterality Date  . BARTHOLIN CYST MARSUPIALIZATION N/A 04/10/2014   Procedure: INCISION AND DRAINAGE OF BARTHOLIN ABCESS ;  Surgeon: Sanjuana Kava, MD;  Location: [facility];  Service: Gynecology;  Laterality: N/A;  . CESAREAN SECTION  x3  last one 01-17-2005   last one with bilateral tubal ligation   . DILATION AND CURETTAGE OF UTERUS  1999  :   Current Outpatient Medications:  .  acetaminophen (TYLENOL) 500 MG tablet, Take 1,000 mg by mouth every 6 (six) hours as needed for mild pain or moderate pain., Disp: , Rfl:  .  guaiFENesin (MUCINEX) 600 MG 12 hr tablet, Take 600 mg by mouth 2 (two) times daily as needed for cough or to loosen phlegm., Disp: , Rfl:  .  Iron-FA-B Cmp-C-Biot-Probiotic (FUSION PLUS) CAPS, Take 1 capsule by mouth 2 (two) times a day., Disp: 30 capsule, Rfl: 4 .  mirtazapine (REMERON) 45 MG  tablet, Take 1 tablet (45 mg total) by mouth at bedtime., Disp: 30 tablet, Rfl: 1 .  SUMAtriptan (IMITREX) 25 MG tablet, Take 1 tablet (25 mg total) by mouth every 6 (six) hours as needed for migraine or headache. May repeat in 2 hours if headache persists or recurs. (Patient not taking: Reported on 08/05/2018), Disp: 30 tablet, Rfl: 0 .  topiramate (TOPAMAX) 25 MG tablet, Take 25 mg daily for 1 week; then changed to 25 mg BID. (Patient not taking: Reported on 08/05/2018), Disp: 60 tablet, Rfl: 1:  Allergies  Allergen Reactions  . Tomato Hives  :  Family History  Problem Relation Age of Onset  . Alcohol abuse Father    . Depression Father   . Drug abuse Sister   . Depression Sister   . Drug abuse Brother   . Cancer Maternal Grandmother        leukemia  . Suicidality Neg Hx   :  Social History   Socioeconomic History  . Marital status: Single    Spouse name: Not on file  . Number of children: Not on file  . Years of education: Not on file  . Highest education level: Not on file  Occupational History  . Not on file  Social Needs  . Financial resource strain: Not on file  . Food insecurity:    Worry: Not on file    Inability: Not on file  . Transportation needs:    Medical: Not on file    Non-medical: Not on file  Tobacco Use  . Smoking status: Never Smoker  . Smokeless tobacco: Never Used  Substance and Sexual Activity  . Alcohol use: Yes    Comment: no alcohol in last one week- was drinking about 1/5th of liquior daily for 2 months  . Drug use: Yes    Types: Marijuana    Comment: daily  . Sexual activity: Not Currently  Lifestyle  . Physical activity:    Days per week: Not on file    Minutes per session: Not on file  . Stress: Not on file  Relationships  . Social connections:    Talks on phone: Not on file    Gets together: Not on file    Attends religious service: Not on file    Active member of club or organization: Not on file    Attends meetings of clubs or organizations: Not on file    Relationship status: Not on file  . Intimate partner violence:    Fear of current or ex partner: Not on file    Emotionally abused: Not on file    Physically abused: Not on file    Forced sexual activity: Not on file  Other Topics Concern  . Not on file  Social History Narrative  . Not on file  :  Pertinent items are noted in HPI.  Exam: Blood pressure 129/61, pulse 74, temperature 98.9 F (37.2 C), temperature source Oral, resp. rate 17, height 5\' 7"  (1.702 m), weight 183 lb 6.4 oz (83.2 kg), SpO2 100 %.  ECOG 0 General appearance: alert and cooperative appeared without  distress. Head: atraumatic without any abnormalities. Eyes: conjunctivae/corneas clear. PERRL.  Sclera anicteric. Throat: lips, mucosa, and tongue normal; without oral thrush or ulcers. Resp: clear to auscultation bilaterally without rhonchi, wheezes or dullness to percussion. Cardio: regular rate and rhythm, S1, S2 normal, no murmur, click, rub or gallop GI: soft, non-tender; bowel sounds normal; no masses,  no organomegaly Skin: Skin color, texture,  turgor normal. No rashes or lesions Lymph nodes: Cervical, supraclavicular, and axillary nodes normal. Neurologic: Grossly normal without any motor, sensory or deep tendon reflexes. Musculoskeletal: No joint deformity or effusion.  CBC    Component Value Date/Time   WBC 4.4 08/04/2018 1813   RBC 3.84 (L) 08/04/2018 1813   HGB 9.0 (L) 08/05/2018 1000   HGB 11.8 03/06/2014 0817   HCT 30.9 (L) 08/05/2018 1000   HCT 39.3 03/06/2014 0817   PLT 375 08/04/2018 1813   PLT 134 (L) 03/06/2014 0817   MCV 69.8 (L) 08/04/2018 1813   MCV 83.1 03/06/2014 0817   MCH 18.5 (L) 08/04/2018 1813   MCHC 26.5 (L) 08/04/2018 1813   RDW 31.4 (H) 08/04/2018 1813   RDW 25.9 (H) 03/06/2014 0817   LYMPHSABS 1.5 04/10/2017 1912   LYMPHSABS 0.5 (L) 03/06/2014 0817   MONOABS 0.4 04/10/2017 1912   MONOABS 0.2 03/06/2014 0817   EOSABS 0.0 04/10/2017 1912   EOSABS 0.1 03/06/2014 0817   BASOSABS 0.0 04/10/2017 1912   BASOSABS 0.0 03/06/2014 0817     Chemistry      Component Value Date/Time   NA 137 04/11/2017 0905   NA 140 04/22/2013 1041   K 3.9 04/11/2017 0905   K 3.7 04/22/2013 1041   CL 111 04/11/2017 0905   CO2 23 04/11/2017 0905   CO2 25 04/22/2013 1041   BUN 7 04/11/2017 0905   BUN 8.8 04/22/2013 1041   CREATININE 0.55 04/11/2017 0905   CREATININE 0.64 08/23/2013 1345   CREATININE 0.7 04/22/2013 1041      Component Value Date/Time   CALCIUM 8.2 (L) 04/11/2017 0905   CALCIUM 9.0 04/22/2013 1041   ALKPHOS 51 04/10/2017 1912   ALKPHOS 60  04/22/2013 1041   AST 26 04/10/2017 1912   AST 18 04/22/2013 1041   ALT 13 (L) 04/10/2017 1912   ALT 11 04/22/2013 1041   BILITOT 0.5 04/10/2017 1912   BILITOT 0.35 04/22/2013 1041       Assessment and Plan:   43 year old woman with the following:  1.  Iron deficiency anemia documented at least since January 2015. Her Iron deficiency is most likely related to chronic menstrual blood losses.  The natural course of this disease and treatment options were reviewed today in detail.  She has been intolerant to oral iron and has also been unsuccessful in treating her iron deficiency.  The option of intravenous iron was discussed today.  She has received Feraheme in the past with excellent tolerance and improvement in her counts.  Complication associated with this therapy include arthralgias, myalgias and rarely anaphylaxis.  Plan is to proceed with intravenous iron in the near future and she is agreeable to proceed with that.  We will need to continue to monitor her counts periodically and replace iron as needed.  2.  Thrombocytosis: This is reactive and related to iron deficiency.  This will correct with correction of her iron deficiency.  I do not suspect that there is a blood disorder.  3.  Menorrhagia: Her coagulation work-up has been unrevealing in the past.  She has normal PT, PTT and fibrinogen as well as normal platelet count and steady state.  I do not think she has a bleeding disorder in her menorrhagia likely related to gynecological causes.  4.  Follow-up: We will be the immediate future to receive intravenous iron and MD follow-up in 3 months.  40  minutes was spent with the patient face-to-face today.  More than 50% of  time was spent on reviewing her disease status, treatment options and addressing future plan of care.   Thank you for the referral.  A copy of this consult has been forwarded to the requesting physician.

## 2018-08-26 ENCOUNTER — Telehealth: Payer: Self-pay | Admitting: Oncology

## 2018-08-26 NOTE — Telephone Encounter (Signed)
Could not reach patient °

## 2018-09-01 ENCOUNTER — Other Ambulatory Visit: Payer: Self-pay

## 2018-09-01 ENCOUNTER — Inpatient Hospital Stay: Payer: Managed Care, Other (non HMO)

## 2018-09-01 VITALS — BP 98/60 | HR 65 | Temp 98.0°F | Resp 18

## 2018-09-01 DIAGNOSIS — D649 Anemia, unspecified: Secondary | ICD-10-CM

## 2018-09-01 DIAGNOSIS — D509 Iron deficiency anemia, unspecified: Secondary | ICD-10-CM | POA: Diagnosis not present

## 2018-09-01 DIAGNOSIS — D508 Other iron deficiency anemias: Secondary | ICD-10-CM

## 2018-09-01 LAB — CBC WITH DIFFERENTIAL (CANCER CENTER ONLY)
Abs Immature Granulocytes: 0.01 10*3/uL (ref 0.00–0.07)
Basophils Absolute: 0 10*3/uL (ref 0.0–0.1)
Basophils Relative: 1 %
Eosinophils Absolute: 0.1 10*3/uL (ref 0.0–0.5)
Eosinophils Relative: 1 %
HCT: 29.9 % — ABNORMAL LOW (ref 36.0–46.0)
Hemoglobin: 8.3 g/dL — ABNORMAL LOW (ref 12.0–15.0)
Immature Granulocytes: 0 %
Lymphocytes Relative: 21 %
Lymphs Abs: 1 10*3/uL (ref 0.7–4.0)
MCH: 21.4 pg — ABNORMAL LOW (ref 26.0–34.0)
MCHC: 27.8 g/dL — ABNORMAL LOW (ref 30.0–36.0)
MCV: 77.1 fL — ABNORMAL LOW (ref 80.0–100.0)
Monocytes Absolute: 0.4 10*3/uL (ref 0.1–1.0)
Monocytes Relative: 9 %
Neutro Abs: 3.3 10*3/uL (ref 1.7–7.7)
Neutrophils Relative %: 68 %
Platelet Count: 519 10*3/uL — ABNORMAL HIGH (ref 150–400)
RBC: 3.88 MIL/uL (ref 3.87–5.11)
RDW: 31.2 % — ABNORMAL HIGH (ref 11.5–15.5)
WBC Count: 4.9 10*3/uL (ref 4.0–10.5)
nRBC: 0 % (ref 0.0–0.2)

## 2018-09-01 LAB — FERRITIN: Ferritin: <4 ng/mL — ABNORMAL LOW (ref 11–307)

## 2018-09-01 LAB — IRON AND TIBC
Iron: 10 ug/dL — ABNORMAL LOW (ref 41–142)
Saturation Ratios: 2 % — ABNORMAL LOW (ref 21–57)
TIBC: 400 ug/dL (ref 236–444)
UIBC: 390 ug/dL — ABNORMAL HIGH (ref 120–384)

## 2018-09-01 MED ORDER — SODIUM CHLORIDE 0.9 % IV SOLN
510.0000 mg | Freq: Once | INTRAVENOUS | Status: AC
  Administered 2018-09-01: 10:00:00 510 mg via INTRAVENOUS
  Filled 2018-09-01: qty 510

## 2018-09-01 MED ORDER — SODIUM CHLORIDE 0.9 % IV SOLN
Freq: Once | INTRAVENOUS | Status: AC
  Administered 2018-09-01: 10:00:00 via INTRAVENOUS
  Filled 2018-09-01: qty 250

## 2018-09-01 NOTE — Patient Instructions (Signed)

## 2018-09-08 ENCOUNTER — Inpatient Hospital Stay: Payer: Managed Care, Other (non HMO)

## 2018-09-08 ENCOUNTER — Other Ambulatory Visit: Payer: Self-pay

## 2018-09-08 VITALS — BP 127/69 | HR 57 | Temp 98.1°F | Resp 18

## 2018-09-08 DIAGNOSIS — D649 Anemia, unspecified: Secondary | ICD-10-CM

## 2018-09-08 DIAGNOSIS — D509 Iron deficiency anemia, unspecified: Secondary | ICD-10-CM | POA: Diagnosis not present

## 2018-09-08 MED ORDER — SODIUM CHLORIDE 0.9 % IV SOLN
510.0000 mg | Freq: Once | INTRAVENOUS | Status: AC
  Administered 2018-09-08: 510 mg via INTRAVENOUS
  Filled 2018-09-08: qty 17

## 2018-09-08 MED ORDER — SODIUM CHLORIDE 0.9 % IV SOLN
Freq: Once | INTRAVENOUS | Status: AC
  Administered 2018-09-08: 10:00:00 via INTRAVENOUS
  Filled 2018-09-08: qty 250

## 2018-09-08 NOTE — Patient Instructions (Signed)
Coronavirus (COVID-19) Are you at risk?  Are you at risk for the Coronavirus (COVID-19)?  To be considered HIGH RISK for Coronavirus (COVID-19), you have to meet the following criteria:  . Traveled to [place], [place], [place], [place] or [place]; or in the [place] to [place], [place], [place], or [place]; and have fever, cough, and shortness of breath within the last 2 weeks of travel OR . Been in close contact with a person diagnosed with COVID-19 within the last 2 weeks and have fever, cough, and shortness of breath . IF YOU DO NOT MEET THESE CRITERIA, YOU ARE CONSIDERED LOW RISK FOR COVID-19.  What to do if you are HIGH RISK for COVID-19?  Marland Kitchen If you are having a medical emergency, call 911. . Seek medical care right away. Before you go to a doctor's office, urgent care or emergency department, call ahead and tell them about your recent travel, contact with someone diagnosed with COVID-19, and your symptoms. You should receive instructions from your physician's office regarding next steps of care.  . When you arrive at healthcare provider, tell the healthcare staff immediately you have returned from visiting [place], [place], [place], [place] or [place]; or traveled in the [place] to [place], [place] [address] [place] [address], [place], or [place]; in the last two weeks or you have been in close contact with a person diagnosed with COVID-19 in the last 2 weeks.   . Tell the health care staff about your symptoms: fever, cough and shortness of breath. . After you have been seen by a medical provider, you will be either: o Tested for (COVID-19) and discharged home on quarantine except to seek medical care if symptoms worsen, and asked to  - Stay home and avoid contact with others until you get your results (4-5 days)  - Avoid travel on public transportation if possible (such as bus, train, or airplane) or o Sent to the Emergency Department by EMS for evaluation, COVID-19 testing, and possible  admission depending on your condition and test results.  What to do if you are LOW RISK for COVID-19?  Reduce your risk of any infection by using the same precautions used for avoiding the common cold or flu:  Marland Kitchen Wash your hands often with soap and warm water for at least 20 seconds.  If soap and water are not readily available, use an alcohol-based hand sanitizer with at least 60% alcohol.  . If coughing or sneezing, cover your mouth and nose by coughing or sneezing into the elbow areas of your shirt or coat, into a tissue or into your sleeve (not your hands). . Avoid shaking hands with others and consider head nods or verbal greetings only. . Avoid touching your eyes, nose, or mouth with unwashed hands.  . Avoid close contact with people who are sick. . Avoid places or events with large numbers of people in one location, like concerts or sporting events. . Carefully consider travel plans you have or are making. If you are planning any travel   Ferumoxytol injection What is this medicine? FERUMOXYTOL is an iron complex. Iron is used to make healthy red blood cells, which carry oxygen and nutrients throughout the body. This medicine is used to treat iron deficiency anemia. This medicine may be used for other purposes; ask your health care provider or pharmacist if you have questions. COMMON BRAND NAME(S): Feraheme What should I tell my health care provider before I take this medicine? They need to know if  you have any of these conditions: -anemia not caused by low iron levels -high levels of iron in the blood -magnetic resonance imaging (MRI) test scheduled -an unusual or allergic reaction to iron, other medicines, foods, dyes, or preservatives -pregnant or trying to get pregnant -breast-feeding How should I use this medicine? This medicine is for injection into a vein. It is given by a health care professional in a hospital or clinic setting. Talk to your pediatrician regarding the use  of this medicine in children. Special care may be needed. Overdosage: If you think you have taken too much of this medicine contact a poison control center or emergency room at once. NOTE: This medicine is only for you. Do not share this medicine with others. What if I miss a dose? It is important not to miss your dose. Call your doctor or health care professional if you are unable to keep an appointment. What may interact with this medicine? This medicine may interact with the following medications: -other iron products This list may not describe all possible interactions. Give your health care provider a list of all the medicines, herbs, non-prescription drugs, or dietary supplements you use. Also tell them if you smoke, drink alcohol, or use illegal drugs. Some items may interact with your medicine. What should I watch for while using this medicine? Visit your doctor or healthcare professional regularly. Tell your doctor or healthcare professional if your symptoms do not start to get better or if they get worse. You may need blood work done while you are taking this medicine. You may need to follow a special diet. Talk to your doctor. Foods that contain iron include: whole grains/cereals, dried fruits, beans, or peas, leafy green vegetables, and organ meats (liver, kidney). What side effects may I notice from receiving this medicine? Side effects that you should report to your doctor or health care professional as soon as possible: -allergic reactions like skin rash, itching or hives, swelling of the face, lips, or tongue -breathing problems -changes in blood pressure -feeling faint or lightheaded, falls -fever or chills -flushing, sweating, or hot feelings -swelling of the ankles or feet Side effects that usually do not require medical attention (report to your doctor or health care professional if they continue or are bothersome): -diarrhea -headache -nausea, vomiting -stomach  pain This list may not describe all possible side effects. Call your doctor for medical advice about side effects. You may report side effects to FDA at 1-800-FDA-1088. Where should I keep my medicine? This drug is given in a hospital or clinic and will not be stored at home. NOTE: This sheet is a summary. It may not cover all possible information. If you have questions about this medicine, talk to your doctor, pharmacist, or health care provider.  2019 Elsevier/Gold Standard (2016-04-28 20:21:10)

## 2018-09-11 ENCOUNTER — Encounter (HOSPITAL_COMMUNITY): Payer: Self-pay

## 2018-09-11 ENCOUNTER — Emergency Department (HOSPITAL_COMMUNITY)
Admission: EM | Admit: 2018-09-11 | Discharge: 2018-09-11 | Disposition: A | Discharge status: Home / Self Care | Payer: Managed Care, Other (non HMO) | Attending: Emergency Medicine | Admitting: Emergency Medicine

## 2018-09-11 ENCOUNTER — Other Ambulatory Visit: Payer: Self-pay

## 2018-09-11 DIAGNOSIS — D696 Thrombocytopenia, unspecified: Secondary | ICD-10-CM | POA: Diagnosis not present

## 2018-09-11 DIAGNOSIS — R040 Epistaxis: Secondary | ICD-10-CM | POA: Diagnosis present

## 2018-09-11 LAB — CBC WITH DIFFERENTIAL/PLATELET
Abs Immature Granulocytes: 0.01 10*3/uL (ref 0.00–0.07)
Basophils Absolute: 0 10*3/uL (ref 0.0–0.1)
Basophils Relative: 1 %
Eosinophils Absolute: 0.1 10*3/uL (ref 0.0–0.5)
Eosinophils Relative: 2 %
HCT: 35.7 % — ABNORMAL LOW (ref 36.0–46.0)
Hemoglobin: 10 g/dL — ABNORMAL LOW (ref 12.0–15.0)
Immature Granulocytes: 0 %
Lymphocytes Relative: 34 %
Lymphs Abs: 2.1 10*3/uL (ref 0.7–4.0)
MCH: 24.6 pg — ABNORMAL LOW (ref 26.0–34.0)
MCHC: 28 g/dL — ABNORMAL LOW (ref 30.0–36.0)
MCV: 87.7 fL (ref 80.0–100.0)
Monocytes Absolute: 0.4 10*3/uL (ref 0.1–1.0)
Monocytes Relative: 7 %
Neutro Abs: 3.5 10*3/uL (ref 1.7–7.7)
Neutrophils Relative %: 56 %
Platelets: 53 10*3/uL — ABNORMAL LOW (ref 150–400)
RBC: 4.07 MIL/uL (ref 3.87–5.11)
RDW: 36.4 % — ABNORMAL HIGH (ref 11.5–15.5)
WBC: 6.2 10*3/uL (ref 4.0–10.5)
nRBC: 0 % (ref 0.0–0.2)

## 2018-09-11 MED ORDER — OXYMETAZOLINE HCL 0.05 % NA SOLN
1.0000 | Freq: Once | NASAL | Status: AC
  Administered 2018-09-11: 1 via NASAL
  Filled 2018-09-11: qty 30

## 2018-09-11 NOTE — ED Notes (Signed)
Afrin given, gauze placed in L nostril.

## 2018-09-11 NOTE — ED Triage Notes (Signed)
Pt reports nosebleed starting around 1030p. No hx of this. Denies pain.

## 2018-09-12 NOTE — ED Provider Notes (Signed)
[facility] DEPT Provider Note   CSN: [id number] Arrival date & time: 09/11/18  0052    History   Chief Complaint Chief Complaint  Patient presents with  . Epistaxis    HPI Meghan [name] is a 43 y.o. female.     The history is provided by the patient and medical records.  Epistaxis Location:  L nare Severity: stopped. Duration:  2 hours (associated with new nose piercing in that side) Timing:  Constant Progression:  Resolved Chronicity:  New Context comment:  Recent nose piercing Relieved by:  Applying pressure and vasoconstrictors   Past Medical History:  Diagnosis Date  . Anxiety   . Blood transfusion reaction   . Infection of Bartholin's gland   . 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 Leukopenia   . Menorrhagia with irregular cycle   . Moderate major depression ([place])   . Wears contact lenses     Patient Active Problem List   Diagnosis Date Noted  . Anemia 08/04/2018  . Headache 04/11/2017  . Dyspnea 04/11/2017  . Thrombocytopenia ([place]) 04/11/2017  . Symptomatic anemia 04/10/2017  . Alcohol abuse 03/13/2015  . Menorrhagia 02/01/2014  . Leukopenia 02/01/2014  . Panic disorder without agoraphobia 12/27/2013  . Cannabis abuse 08/23/2013  . Generalized anxiety disorder 08/23/2013  . MDD (major depressive disorder), recurrent episode, moderate ([place]) 08/23/2013  . Iron deficiency anemia 04/27/2013  . Pancytopenia ([place]) 04/22/2013    Past Surgical History:  Procedure Laterality Date  . BARTHOLIN CYST MARSUPIALIZATION N/A 04/10/2014   Procedure: INCISION AND DRAINAGE OF BARTHOLIN ABCESS ;  Surgeon: Sanjuana Kava, MD;  Location: [facility];  Service: Gynecology;  Laterality: N/A;  . CESAREAN SECTION  x3  last one 01-17-2005   last one with bilateral tubal ligation   . DILATION AND CURETTAGE OF UTERUS  1999     OB History   No obstetric history on file.       Home Medications    Prior to Admission medications   Medication Sig Start Date End Date Taking? Authorizing Provider  albuterol (VENTOLIN HFA) 108 (90 Base) MCG/ACT inhaler Inhale 2 puffs into the lungs every 6 (six) hours as needed for wheezing or shortness of breath.   Yes [provider]  naproxen sodium (ALEVE) 220 MG tablet Take 220 mg by mouth 2 (two) times daily as needed (pain).   Yes [provider]  mirtazapine (REMERON) 45 MG tablet Take 1 tablet (45 mg total) by mouth at bedtime. Patient not taking: Reported on 09/11/2018 03/13/15 09/11/18  Charlcie Cradle, MD  SUMAtriptan (IMITREX) 25 MG tablet Take 1 tablet (25 mg total) by mouth every 6 (six) hours as needed for migraine or headache. May repeat in 2 hours if headache persists or recurs. Patient not taking: Reported on 08/05/2018 04/12/17 09/11/18  Barton Dubois, MD  topiramate (TOPAMAX) 25 MG tablet Take 25 mg daily for 1 week; then changed to 25 mg BID. Patient not taking: Reported on 08/05/2018 04/12/17 09/11/18  Barton Dubois, MD    Family History Family History  Problem Relation Age of Onset  . Alcohol abuse Father   . Depression Father   . Drug abuse Sister   . Depression Sister   . Drug abuse Brother   . Cancer Maternal Grandmother        leukemia  . Suicidality Neg Hx     Social History Social History   Tobacco Use  .  Smoking status: Never Smoker  . Smokeless tobacco: Never Used  Substance Use Topics  . Alcohol use: Yes    Comment: no alcohol in last one week- was drinking about 1/5th of liquior daily for 2 months  . Drug use: Yes    Types: Marijuana    Comment: daily     Allergies   Tomato   Review of Systems Review of Systems  HENT: Positive for nosebleeds.   All other systems reviewed and are negative.    Physical Exam Updated Vital Signs BP 132/68 (BP Location: Right Arm)   Pulse 63   Temp 97.9 F (36.6 C)   Resp 15   SpO2 100%   Physical Exam Vitals signs and  nursing note reviewed.  Constitutional:      Appearance: She is well-developed.  HENT:     Head: Normocephalic and atraumatic.     Nose:     Comments: Clot without active bleeding to left nare in area of piercing, nothing further back. Right nare clear.    Mouth/Throat:     Pharynx: Oropharynx is clear.  Eyes:     Extraocular Movements: Extraocular movements intact.     Conjunctiva/sclera: Conjunctivae normal.  Neck:     Musculoskeletal: Normal range of motion.  Cardiovascular:     Rate and Rhythm: Normal rate and regular rhythm.  Pulmonary:     Effort: Pulmonary effort is normal. No respiratory distress.     Breath sounds: No stridor.  Abdominal:     General: There is no distension.     Tenderness: There is no abdominal tenderness.  Neurological:     Mental Status: She is alert.      ED Treatments / Results  Labs (all labs ordered are listed, but only abnormal results are displayed) Labs Reviewed  CBC WITH DIFFERENTIAL/PLATELET - Abnormal; Notable for the following components:      Result Value   Hemoglobin 10.0 (*)    HCT 35.7 (*)    MCH 24.6 (*)    MCHC 28.0 (*)    RDW 36.4 (*)    Platelets 53 (*)    All other components within normal limits    EKG None  Radiology No results found.  Procedures Procedures (including critical care time)  Medications Ordered in ED Medications  oxymetazoline (AFRIN) 0.05 % nasal spray 1 spray (1 spray Each Nare Given 09/11/18 0123)     Initial Impression / Assessment and Plan / ED Course  I have reviewed the triage vital signs and the nursing notes.  Pertinent labs & imaging results that were available during my care of the patient were reviewed by me and considered in my medical decision making (see chart for details).        2/2 h/o blood loss anemia and recent (last few years) difficulty with bleeding easily a cbc was checked. She had a large (seemingly complete) workup a few years back for anemia, so didn't feel  need to repeat at this time but was found to be thrombocytopenic this time. Not at a dangerous level and she has not had bleeding in over an hour. She already has close follow up with hematologist, she will make aware. I will send message in epic as well.   Final Clinical Impressions(s) / ED Diagnoses   Final diagnoses:  Thrombocytopenia [facility])    ED Discharge Orders    None       Janaya Broy, Corene Cornea, MD 09/12/18 [phone]

## 2018-12-09 ENCOUNTER — Inpatient Hospital Stay (HOSPITAL_BASED_OUTPATIENT_CLINIC_OR_DEPARTMENT_OTHER): Payer: Managed Care, Other (non HMO) | Admitting: Oncology

## 2018-12-09 ENCOUNTER — Other Ambulatory Visit: Payer: Self-pay

## 2018-12-09 ENCOUNTER — Inpatient Hospital Stay: Payer: Managed Care, Other (non HMO) | Attending: Oncology

## 2018-12-09 VITALS — BP 132/80 | HR 62 | Temp 98.0°F | Resp 17 | Ht 67.0 in | Wt 190.1 lb

## 2018-12-09 DIAGNOSIS — D696 Thrombocytopenia, unspecified: Secondary | ICD-10-CM | POA: Insufficient documentation

## 2018-12-09 DIAGNOSIS — D509 Iron deficiency anemia, unspecified: Secondary | ICD-10-CM | POA: Insufficient documentation

## 2018-12-09 DIAGNOSIS — D508 Other iron deficiency anemias: Secondary | ICD-10-CM

## 2018-12-09 DIAGNOSIS — Z79899 Other long term (current) drug therapy: Secondary | ICD-10-CM | POA: Insufficient documentation

## 2018-12-09 DIAGNOSIS — D649 Anemia, unspecified: Secondary | ICD-10-CM | POA: Diagnosis not present

## 2018-12-09 DIAGNOSIS — N920 Excessive and frequent menstruation with regular cycle: Secondary | ICD-10-CM | POA: Insufficient documentation

## 2018-12-09 LAB — IRON AND TIBC
Iron: 32 ug/dL — ABNORMAL LOW (ref 41–142)
Saturation Ratios: 8 % — ABNORMAL LOW (ref 21–57)
TIBC: 388 ug/dL (ref 236–444)
UIBC: 356 ug/dL (ref 120–384)

## 2018-12-09 LAB — CBC WITH DIFFERENTIAL (CANCER CENTER ONLY)
Abs Immature Granulocytes: 0.01 10*3/uL (ref 0.00–0.07)
Basophils Absolute: 0 10*3/uL (ref 0.0–0.1)
Basophils Relative: 1 %
Eosinophils Absolute: 0.1 10*3/uL (ref 0.0–0.5)
Eosinophils Relative: 2 %
HCT: 38.9 % (ref 36.0–46.0)
Hemoglobin: 12.2 g/dL (ref 12.0–15.0)
Immature Granulocytes: 0 %
Lymphocytes Relative: 28 %
Lymphs Abs: 1 10*3/uL (ref 0.7–4.0)
MCH: 26.8 pg (ref 26.0–34.0)
MCHC: 31.4 g/dL (ref 30.0–36.0)
MCV: 85.5 fL (ref 80.0–100.0)
Monocytes Absolute: 0.4 10*3/uL (ref 0.1–1.0)
Monocytes Relative: 12 %
Neutro Abs: 1.9 10*3/uL (ref 1.7–7.7)
Neutrophils Relative %: 57 %
Platelet Count: 225 10*3/uL (ref 150–400)
RBC: 4.55 MIL/uL (ref 3.87–5.11)
RDW: 18 % — ABNORMAL HIGH (ref 11.5–15.5)
WBC Count: 3.4 10*3/uL — ABNORMAL LOW (ref 4.0–10.5)
nRBC: 0 % (ref 0.0–0.2)

## 2018-12-09 LAB — FERRITIN: Ferritin: 5 ng/mL — ABNORMAL LOW (ref 11–307)

## 2018-12-09 MED ORDER — FERROUS SULFATE 325 (65 FE) MG PO TBEC: 325.0000 mg | DELAYED_RELEASE_TABLET | Freq: Every day | ORAL | 3 refills | Status: DC

## 2018-12-09 NOTE — Progress Notes (Signed)
Hematology and Oncology Follow Up Visit  Meghan [name] [id number] [birth year] 43 y.o. 12/09/2018 9:57 AM Patient, No Pcp PerNo ref. provider found   Principle Diagnosis: 43 year old woman with iron deficiency anemia diagnosed in 2015.  Anemia is related to menorrhagia and menstrual blood losses.  She presented with worsening anemia in June 2020 with iron level of 10 and ferritin of less than 4.   Current therapy: IV iron intermittently utilizing Feraheme.  She received 1000 mg June 2020.  Interim History: Meghan [name] presents today for a follow-up visit.  Since the last visit, she received intravenous iron with improvement in her overall symptoms.  She denies any nausea, vomiting or excessive fatigue.  She denies shortness of breath or difficulty breathing.  He denies any recent hospitalization or illnesses.  She did report some bleeding after her nose piercing which has stopped at this time.  Patient denied any alteration mental status, neuropathy, confusion or dizziness.  Denies any headaches or lethargy.  Denies any night sweats, weight loss or changes in appetite.  Denied orthopnea, dyspnea on exertion or chest discomfort.  Denies shortness of breath, difficulty breathing hemoptysis or cough.  Denies any abdominal distention, nausea, early satiety or dyspepsia.  Denies any hematuria, frequency, dysuria or nocturia.  Denies any skin irritation, dryness or rash.  Denies any ecchymosis or petechiae.  Denies any lymphadenopathy or clotting.  Denies any heat or cold intolerance.  Denies any anxiety or depression.  Remaining review of system is negative.     Medications: I have reviewed the patient's current medications.  Current Outpatient Medications  Medication Sig Dispense Refill  . albuterol (VENTOLIN HFA) 108 (90 Base) MCG/ACT inhaler Inhale 2 puffs into the lungs every 6 (six) hours as needed for wheezing or shortness of breath.    . naproxen sodium (ALEVE) 220 MG tablet Take 220 mg by  mouth 2 (two) times daily as needed (pain).     No current facility-administered medications for this visit.      Allergies:  Allergies  Allergen Reactions  . Tomato Hives    Past Medical History, Surgical history, Social history, and Family History were reviewed and updated.   Physical Exam: Blood pressure 132/80, pulse 62, temperature 98 F (36.7 C), temperature source Oral, resp. rate 17, height 5\' 7"  (1.702 m), weight 190 lb 1.6 oz (86.2 kg), SpO2 100 %. ECOG: 0   General appearance: Comfortable appearing without any discomfort Head: Normocephalic without any trauma Oropharynx: Mucous membranes are moist and pink without any thrush or ulcers. Eyes: Pupils are equal and round reactive to light. Lymph nodes: No cervical, supraclavicular, inguinal or axillary lymphadenopathy.   Heart:regular rate and rhythm.  S1 and S2 without leg edema. Lung: Clear without any rhonchi or wheezes.  No dullness to percussion. Abdomin: Soft, nontender, nondistended with good bowel sounds.  No hepatosplenomegaly. Musculoskeletal: No joint deformity or effusion.  Full range of motion noted. Neurological: No deficits noted on motor, sensory and deep tendon reflex exam. Skin: No petechial rash or dryness.  Appeared moist.      Lab Results: Lab Results  Component Value Date   WBC 3.4 (L) 12/09/2018   HGB 12.2 12/09/2018   HCT 38.9 12/09/2018   MCV 85.5 12/09/2018   PLT 225 12/09/2018     Chemistry      Component Value Date/Time   NA 137 04/11/2017 0905   NA 140 04/22/2013 1041   K 3.9 04/11/2017 0905   K 3.7 04/22/2013 1041   CL  111 04/11/2017 0905   CO2 23 04/11/2017 0905   CO2 25 04/22/2013 1041   BUN 7 04/11/2017 0905   BUN 8.8 04/22/2013 1041   CREATININE 0.55 04/11/2017 0905   CREATININE 0.64 08/23/2013 1345   CREATININE 0.7 04/22/2013 1041      Component Value Date/Time   CALCIUM 8.2 (L) 04/11/2017 0905   CALCIUM 9.0 04/22/2013 1041   ALKPHOS 51 04/10/2017 1912    ALKPHOS 60 04/22/2013 1041   AST 26 04/10/2017 1912   AST 18 04/22/2013 1041   ALT 13 (L) 04/10/2017 1912   ALT 11 04/22/2013 1041   BILITOT 0.5 04/10/2017 1912   BILITOT 0.35 04/22/2013 1041         Impression and Plan: 43 year old woman with:    1.  Anemia related to chronic bleeding related to menstrual blood losses with iron deficiency diagnosed in 2015.  He presented with worsening anemia in June 2020.   He status post Feraheme infusion with normalization of her hemoglobin at this time.  Risks and benefits of continuing this therapy as well as trying oral iron was discussed.  She is willing to try oral iron therapy I will continue to monitor periodically and repeat intravenous iron as needed..  2.  Thrombocytosis: Due to iron deficiency and corrected at this time.  3.  Menorrhagia: I recommended follow-up with gynecology regarding this issue.  4.  Follow-up: In 6 months for repeat evaluation.  15  minutes was spent with the patient face-to-face today.  More than 50% of time was dedicated to reviewing laboratory data, treatment options and answering questions regarding future plan of care.   Zola Button, MD 9/17/20209:57 AM

## 2018-12-10 ENCOUNTER — Telehealth: Payer: Self-pay | Admitting: Oncology

## 2018-12-10 NOTE — Telephone Encounter (Signed)
Called and left msg. Mailed printout  °

## 2019-02-20 ENCOUNTER — Other Ambulatory Visit: Payer: Self-pay

## 2019-02-20 ENCOUNTER — Emergency Department (HOSPITAL_COMMUNITY)
Admission: EM | Admit: 2019-02-20 | Discharge: 2019-02-20 | Disposition: A | Discharge status: Home / Self Care | Payer: Managed Care, Other (non HMO) | Attending: Emergency Medicine | Admitting: Emergency Medicine

## 2019-02-20 DIAGNOSIS — F419 Anxiety disorder, unspecified: Secondary | ICD-10-CM | POA: Diagnosis not present

## 2019-02-20 DIAGNOSIS — N3000 Acute cystitis without hematuria: Secondary | ICD-10-CM | POA: Insufficient documentation

## 2019-02-20 DIAGNOSIS — Z79899 Other long term (current) drug therapy: Secondary | ICD-10-CM | POA: Diagnosis not present

## 2019-02-20 DIAGNOSIS — R6884 Jaw pain: Secondary | ICD-10-CM | POA: Diagnosis not present

## 2019-02-20 DIAGNOSIS — R5383 Other fatigue: Secondary | ICD-10-CM | POA: Diagnosis present

## 2019-02-20 LAB — CBC
HCT: 34.6 % — ABNORMAL LOW (ref 36.0–46.0)
Hemoglobin: 10.1 g/dL — ABNORMAL LOW (ref 12.0–15.0)
MCH: 23.8 pg — ABNORMAL LOW (ref 26.0–34.0)
MCHC: 29.2 g/dL — ABNORMAL LOW (ref 30.0–36.0)
MCV: 81.6 fL (ref 80.0–100.0)
Platelets: 335 10*3/uL (ref 150–400)
RBC: 4.24 MIL/uL (ref 3.87–5.11)
RDW: 18 % — ABNORMAL HIGH (ref 11.5–15.5)
WBC: 5 10*3/uL (ref 4.0–10.5)
nRBC: 0 % (ref 0.0–0.2)

## 2019-02-20 LAB — I-STAT BETA HCG BLOOD, ED (MC, WL, AP ONLY): I-stat hCG, quantitative: <5 m[IU]/mL (ref <5)

## 2019-02-20 LAB — URINALYSIS, ROUTINE W REFLEX MICROSCOPIC
Bilirubin Urine: NEGATIVE
Glucose, UA: NEGATIVE mg/dL
Ketones, ur: NEGATIVE mg/dL
Leukocytes,Ua: NEGATIVE
Nitrite: POSITIVE — AB
Protein, ur: NEGATIVE mg/dL
Specific Gravity, Urine: 1.018 (ref 1.005–1.030)
pH: 7 (ref 5.0–8.0)

## 2019-02-20 LAB — BASIC METABOLIC PANEL
Anion gap: 6 (ref 5–15)
BUN: 12 mg/dL (ref 6–20)
CO2: 24 mmol/L (ref 22–32)
Calcium: 8.6 mg/dL — ABNORMAL LOW (ref 8.9–10.3)
Chloride: 110 mmol/L (ref 98–111)
Creatinine, Ser: 0.59 mg/dL (ref 0.44–1.00)
GFR calc Af Amer: >60 mL/min (ref >60)
GFR calc non Af Amer: >60 mL/min (ref >60)
Glucose, Bld: 94 mg/dL (ref 70–99)
Potassium: 3.4 mmol/L — ABNORMAL LOW (ref 3.5–5.1)
Sodium: 140 mmol/L (ref 135–145)

## 2019-02-20 MED ORDER — ACETAMINOPHEN 500 MG PO TABS
1000.0000 mg | ORAL_TABLET | Freq: Once | ORAL | Status: AC
  Administered 2019-02-20: 17:00:00 1000 mg via ORAL
  Filled 2019-02-20: qty 2

## 2019-02-20 MED ORDER — CEPHALEXIN 500 MG PO CAPS: 500.0000 mg | ORAL_CAPSULE | Freq: Two times a day (BID) | ORAL | 0 refills | Status: DC

## 2019-02-20 MED ORDER — SODIUM CHLORIDE 0.9% FLUSH: 3.0000 mL | Freq: Once | INTRAVENOUS | Status: DC

## 2019-02-20 NOTE — ED Triage Notes (Signed)
Patient presents to the ED with complaint of fatigue, anxiety, and jaw pain. Patient reports she woke up this morning and felt so weak and her jaw was hurting and it felt like she could barely open her mouth. Patient reports she has a hx of anemia and blood transfusions and is concerned her hemoglobin is low.

## 2019-02-20 NOTE — ED Provider Notes (Signed)
[facility] DEPT Provider Note   CSN: [id number] Arrival date & time: 02/20/19  1603     History   Chief Complaint Chief Complaint  Patient presents with  . Fatigue  . Jaw Pain    HPI Meghan [name] is a 43 y.o. female.     Patient is a 43 year old female who presents with anxiety and tingling.  She states that she has been fatigued lately and does have a history of iron deficiency anemia related to heavy menstrual cycles.  She states that this afternoon she had an anxiety attack.  She went to lay down and started having pain in her right jaw.  It is just below her ear.  Hurts when she talks.  She denies any chest pain or shortness of breath.  She denies any ear pain.  No tooth pain.  She said she felt tingly all over and when EMS got there she was breathing really fast.  She denies any weakness in her extremities.  No difficulty with her speech or vision.  She is feeling better now.  She is not sure what led to her panic attacks today.  She denies any depression or suicidal ideations.     Past Medical History:  Diagnosis Date  . Anxiety   . Blood transfusion reaction   . Infection of Bartholin's gland   . 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 Leukopenia   . Menorrhagia with irregular cycle   . Moderate major depression ([place])   . Wears contact lenses     Patient Active Problem List   Diagnosis Date Noted  . Anemia 08/04/2018  . Headache 04/11/2017  . Dyspnea 04/11/2017  . Thrombocytopenia ([place]) 04/11/2017  . Symptomatic anemia 04/10/2017  . Alcohol abuse 03/13/2015  . Menorrhagia 02/01/2014  . Leukopenia 02/01/2014  . Panic disorder without agoraphobia 12/27/2013  . Cannabis abuse 08/23/2013  . Generalized anxiety disorder 08/23/2013  . MDD (major depressive disorder), recurrent episode, moderate ([place]) 08/23/2013  . Iron deficiency anemia 04/27/2013  . Pancytopenia ([place]) 04/22/2013     Past Surgical History:  Procedure Laterality Date  . BARTHOLIN CYST MARSUPIALIZATION N/A 04/10/2014   Procedure: INCISION AND DRAINAGE OF BARTHOLIN ABCESS ;  Surgeon: Sanjuana Kava, MD;  Location: [facility];  Service: Gynecology;  Laterality: N/A;  . CESAREAN SECTION  x3  last one 01-17-2005   last one with bilateral tubal ligation   . DILATION AND CURETTAGE OF UTERUS  1999     OB History   No obstetric history on file.      Home Medications    Prior to Admission medications   Medication Sig Start Date End Date Taking? Authorizing Provider  albuterol (VENTOLIN HFA) 108 (90 Base) MCG/ACT inhaler Inhale 2 puffs into the lungs every 6 (six) hours as needed for wheezing or shortness of breath.    [provider]  cephALEXin (KEFLEX) 500 MG capsule Take 1 capsule (500 mg total) by mouth 2 (two) times daily. 02/20/19   Malvin Johns, MD  ferrous sulfate 325 (65 FE) MG EC tablet Take 1 tablet (325 mg total) by mouth daily with breakfast. 12/09/18   Wyatt Portela, MD  naproxen sodium (ALEVE) 220 MG tablet Take 220 mg by mouth 2 (two) times daily as needed (pain).    [provider]  mirtazapine (REMERON) 45 MG tablet Take 1 tablet (45 mg total) by mouth at bedtime. Patient not taking: Reported on  09/11/2018 03/13/15 09/11/18  Charlcie Cradle, MD  SUMAtriptan (IMITREX) 25 MG tablet Take 1 tablet (25 mg total) by mouth every 6 (six) hours as needed for migraine or headache. May repeat in 2 hours if headache persists or recurs. Patient not taking: Reported on 08/05/2018 04/12/17 09/11/18  Barton Dubois, MD  topiramate (TOPAMAX) 25 MG tablet Take 25 mg daily for 1 week; then changed to 25 mg BID. Patient not taking: Reported on 08/05/2018 04/12/17 09/11/18  Barton Dubois, MD    Family History Family History  Problem Relation Age of Onset  . Alcohol abuse Father   . Depression Father   . Drug abuse Sister   . Depression Sister   . Drug abuse Brother   . Cancer  Maternal Grandmother        leukemia  . Suicidality Neg Hx     Social History Social History   Tobacco Use  . Smoking status: Never Smoker  . Smokeless tobacco: Never Used  Substance Use Topics  . Alcohol use: Yes    Comment: no alcohol in last one week- was drinking about 1/5th of liquior daily for 2 months  . Drug use: Yes    Types: Marijuana    Comment: daily     Allergies   Tomato   Review of Systems Review of Systems  Constitutional: Positive for fatigue. Negative for chills, diaphoresis and fever.  HENT: Negative for congestion, rhinorrhea and sneezing.        Jaw pain  Eyes: Negative.   Respiratory: Negative for cough, chest tightness and shortness of breath.   Cardiovascular: Negative for chest pain and leg swelling.  Gastrointestinal: Negative for abdominal pain, blood in stool, diarrhea, nausea and vomiting.  Genitourinary: Negative for difficulty urinating, flank pain, frequency and hematuria.  Musculoskeletal: Negative for arthralgias and back pain.  Skin: Negative for rash.  Neurological: Positive for numbness (Tingling to all her extremities). Negative for dizziness, speech difficulty, weakness and headaches.  Psychiatric/Behavioral: Negative for suicidal ideas. The patient is nervous/anxious.      Physical Exam Updated Vital Signs BP 139/71   Pulse 70   Temp 98.8 F (37.1 C) (Oral)   Resp 18   LMP 01/31/2019   SpO2 100%   Physical Exam Constitutional:      Appearance: She is well-developed.  HENT:     Head: Normocephalic and atraumatic.     Right Ear: Tympanic membrane normal.     Mouth/Throat:     Mouth: Mucous membranes are moist.     Pharynx: Oropharynx is clear. No oropharyngeal exudate or posterior oropharyngeal erythema.     Comments: Mild tenderness over the TMJ.  There is no pain around the teeth.  No facial swelling.  No jaw clicks.  Her ear appears normal.   Eyes:     Pupils: Pupils are equal, round, and reactive to light.  Neck:      Musculoskeletal: Normal range of motion and neck supple.  Cardiovascular:     Rate and Rhythm: Normal rate and regular rhythm.     Heart sounds: Normal heart sounds.  Pulmonary:     Effort: Pulmonary effort is normal. No respiratory distress.     Breath sounds: Normal breath sounds. No wheezing or rales.  Chest:     Chest wall: No tenderness.  Abdominal:     General: Bowel sounds are normal.     Palpations: Abdomen is soft.     Tenderness: There is no abdominal tenderness. There is no guarding or  rebound.  Musculoskeletal: Normal range of motion.  Lymphadenopathy:     Cervical: No cervical adenopathy.  Skin:    General: Skin is warm and dry.     Findings: No rash.  Neurological:     Mental Status: She is alert and oriented to person, place, and time.      ED Treatments / Results  Labs (all labs ordered are listed, but only abnormal results are displayed) Labs Reviewed  BASIC METABOLIC PANEL - Abnormal; Notable for the following components:      Result Value   Potassium 3.4 (*)    Calcium 8.6 (*)    All other components within normal limits  CBC - Abnormal; Notable for the following components:   Hemoglobin 10.1 (*)    HCT 34.6 (*)    MCH 23.8 (*)    MCHC 29.2 (*)    RDW 18.0 (*)    All other components within normal limits  URINALYSIS, ROUTINE W REFLEX MICROSCOPIC - Abnormal; Notable for the following components:   Hgb urine dipstick SMALL (*)    Nitrite POSITIVE (*)    Bacteria, UA MANY (*)    All other components within normal limits  I-STAT BETA HCG BLOOD, ED ([facility], [facility], [facility] ONLY)    EKG EKG Interpretation  Date/Time:  Sunday February 20 2019 16:22:02 EST Ventricular Rate:  68 PR Interval:    QRS Duration: 105 QT Interval:  466 QTC Calculation: 496 R Axis:   57 Text Interpretation: Sinus rhythm Borderline prolonged QT interval similar to prior EKGs Confirmed by Marquez Ceesay ([id number]) on 02/20/2019 4:24:04 PM   Radiology No results found.   Procedures Procedures (including critical care time)  Medications Ordered in ED Medications  sodium chloride flush (NS) 0.9 % injection 3 mL (has no administration in time range)  acetaminophen (TYLENOL) tablet 1,000 mg (1,000 mg Oral Given 02/20/19 1702)     Initial Impression / Assessment and Plan / ED Course  I have reviewed the triage vital signs and the nursing notes.  Pertinent labs & imaging results that were available during my care of the patient were reviewed by me and considered in my medical decision making (see chart for details).        Patient is a 43 year old female who presents with an anxiety attack associated some tingling in her hands.  She also had some pain in her jaw.  It is reproducible but I do not find a definitive etiology.  It does not appear to be coming from her tooth.  Her ear looks good.  There is no swelling noted.  It may be a little flareup of some TMJ.  It does not sound like ACS or cardiac in nature.  Her EKG does not show any concerning findings.  Her labs are nonconcerning.  Her hemoglobin is similar to her prior values.  Her urine does look infected which may explain some of her recent fatigue.  She was started on Keflex.  She was given symptomatic care instructions and return precautions.  Final Clinical Impressions(s) / ED Diagnoses   Final diagnoses:  Acute cystitis without hematuria  Anxiety    ED Discharge Orders         Ordered    cephALEXin (KEFLEX) 500 MG capsule  2 times daily     11 /29/20 1840           Malvin Johns, MD 02/20/19 1842

## 2019-04-12 ENCOUNTER — Inpatient Hospital Stay: Payer: Managed Care, Other (non HMO) | Admitting: Oncology

## 2019-04-12 ENCOUNTER — Inpatient Hospital Stay: Payer: Managed Care, Other (non HMO)

## 2019-04-12 ENCOUNTER — Telehealth: Payer: Self-pay | Admitting: Oncology

## 2019-04-12 NOTE — Telephone Encounter (Signed)
Returned patient's phone call regarding rescheduling 01/19 missed appointment, per patient's request appointment has been rescheduled for 02/02.

## 2019-04-26 ENCOUNTER — Inpatient Hospital Stay: Payer: Managed Care, Other (non HMO) | Admitting: Oncology

## 2019-04-26 ENCOUNTER — Inpatient Hospital Stay: Payer: Managed Care, Other (non HMO)

## 2019-06-02 IMAGING — DX DG CHEST 1V PORT
1 series · 1 of 1 positions shown · non-contrast
Comparison: 01/04/2013

CLINICAL DATA: Shortness of breath with headache

EXAM:
PORTABLE CHEST 1 VIEW

[chest ap]
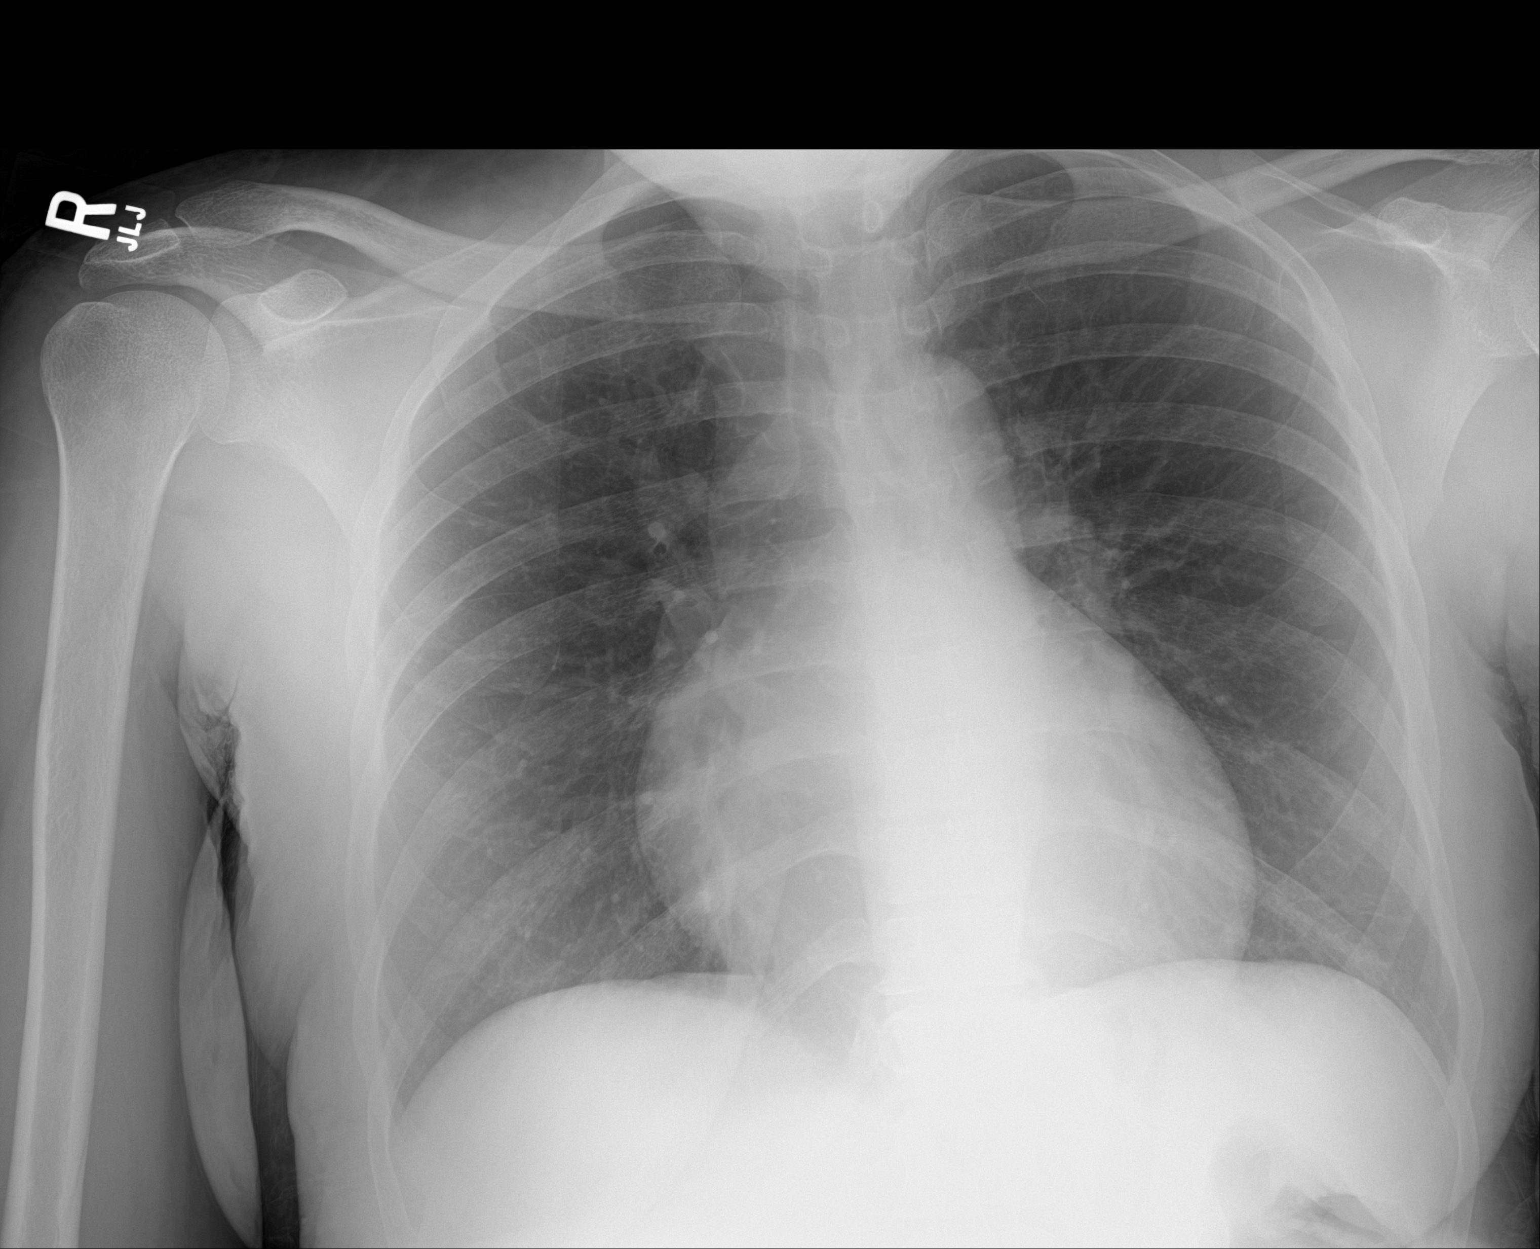

[1 of 1 positions shown; findings below may reference images not displayed]

FINDINGS: Mild cardiomegaly.  No consolidation or effusion.  No pneumothorax.
IMPRESSION: No active disease.  Mild cardiomegaly.

## 2022-01-17 ENCOUNTER — Encounter: Payer: Self-pay | Admitting: Oncology

## 2022-01-17 ENCOUNTER — Encounter (HOSPITAL_COMMUNITY): Payer: Self-pay | Admitting: Emergency Medicine

## 2022-01-17 ENCOUNTER — Other Ambulatory Visit: Payer: Self-pay

## 2022-01-17 ENCOUNTER — Emergency Department (HOSPITAL_COMMUNITY)
Admission: EM | Admit: 2022-01-17 | Discharge: 2022-01-17 | Disposition: A | Discharge status: Home / Self Care | Payer: BC Managed Care – PPO | Attending: Emergency Medicine | Admitting: Emergency Medicine

## 2022-01-17 DIAGNOSIS — H5713 Ocular pain, bilateral: Secondary | ICD-10-CM | POA: Diagnosis present

## 2022-01-17 DIAGNOSIS — H1033 Unspecified acute conjunctivitis, bilateral: Secondary | ICD-10-CM | POA: Insufficient documentation

## 2022-01-17 MED ORDER — FLUORESCEIN SODIUM 1 MG OP STRP
1.0000 | ORAL_STRIP | Freq: Once | OPHTHALMIC | Status: AC
  Administered 2022-01-17: 1 via OPHTHALMIC
  Filled 2022-01-17: qty 1

## 2022-01-17 MED ORDER — LEVOFLOXACIN 0.5 % OP SOLN: 1.0000 [drp] | Freq: Three times a day (TID) | OPHTHALMIC | 0 refills | Status: AC

## 2022-01-17 NOTE — ED Triage Notes (Signed)
Pt c/o bilateral eye drainage, redness, and pain x 2 days.

## 2022-01-17 NOTE — Discharge Instructions (Signed)
Likely you have a bacterial infection in your eyes, I have started you on drops please take as prescribed, do not wear your contacts please use your glasses, you may use over-the-counter moisturizing drops as needed.  Please follow-up with an ophthalmologist next 1 to 2 days for reassessment.  Come back to the emergency department if you develop chest pain, shortness of breath, severe abdominal pain, uncontrolled nausea, vomiting, diarrhea.

## 2022-01-17 NOTE — ED Provider Notes (Signed)
[facility] DEPT Provider Note   CSN: [id number] Arrival date & time: 01/17/22  0020     History  Chief Complaint  Patient presents with   Eye Drainage    Meghan [name] is a 46 y.o. female.  HPI   Medical history including anxiety, contact wearer, depression presents with complaint of bilateral eye pain.  Patient states it started yesterday initially was in her left eye, she states she feels a itchy and burning-like sensation, she has been having some drainage, now is also in her right eye, she denies any visual changes, she states she has a slight headache but denies any paresthesias or weakness of the upper or lower extremities, she believes that the slight headache is from the eye irritation, she denies any trauma to the area, she states that she wears contacts she did remove them, she is never had this in the past she has no other complaints.    Home Medications Prior to Admission medications   Medication Sig Start Date End Date Taking? Authorizing Provider  levofloxacin Theodoro Clock) 0.5 % ophthalmic solution Place 1 drop into both eyes 3 (three) times daily for 7 days. 01/17/22 01/24/22 Yes Marcello Fennel, PA-C  albuterol (VENTOLIN HFA) 108 (90 Base) MCG/ACT inhaler Inhale 2 puffs into the lungs every 6 (six) hours as needed for wheezing or shortness of breath.    [provider]  cephALEXin (KEFLEX) 500 MG capsule Take 1 capsule (500 mg total) by mouth 2 (two) times daily. 02/20/19   Malvin Johns, MD  ferrous sulfate 325 (65 FE) MG EC tablet Take 1 tablet (325 mg total) by mouth daily with breakfast. 12/09/18   Wyatt Portela, MD  naproxen sodium (ALEVE) 220 MG tablet Take 220 mg by mouth 2 (two) times daily as needed (pain).    [provider]  mirtazapine (REMERON) 45 MG tablet Take 1 tablet (45 mg total) by mouth at bedtime. Patient not taking: Reported on 09/11/2018 03/13/15 09/11/18  Charlcie Cradle, MD  SUMAtriptan  (IMITREX) 25 MG tablet Take 1 tablet (25 mg total) by mouth every 6 (six) hours as needed for migraine or headache. May repeat in 2 hours if headache persists or recurs. Patient not taking: Reported on 08/05/2018 04/12/17 09/11/18  Barton Dubois, MD  topiramate (TOPAMAX) 25 MG tablet Take 25 mg daily for 1 week; then changed to 25 mg BID. Patient not taking: Reported on 08/05/2018 04/12/17 09/11/18  Barton Dubois, MD      Allergies    Tomato    Review of Systems   Review of Systems  Constitutional:  Negative for chills and fever.  Eyes:  Positive for discharge, redness and itching.  Respiratory:  Negative for shortness of breath.   Cardiovascular:  Negative for chest pain.  Gastrointestinal:  Negative for abdominal pain.  Neurological:  Negative for headaches.    Physical Exam Updated Vital Signs BP 126/72   Pulse 71   Temp 98.1 F (36.7 C) (Oral)   Resp 17   Ht '5\' 7"'$  (1.702 m)   Wt 81.6 kg   SpO2 100%   BMI 28.19 kg/m  Physical Exam Vitals and nursing note reviewed.  Constitutional:      General: She is not in acute distress.    Appearance: She is not ill-appearing.  HENT:     Head: Normocephalic and atraumatic.     Nose: No congestion.  Eyes:     General:        Right  eye: Discharge present.        Left eye: Discharge present.    Extraocular Movements: Extraocular movements intact.     Conjunctiva/sclera: Conjunctivae normal.     Pupils: Pupils are equal, round, and reactive to light.     Comments: No proptosis, no orbital or periorbital edema or erythema, slight drainage present bilaterally, PERRLA, EOMs fully intact, she has slight scleral injections bilaterally, no dendritic lesion present, no obvious foreign body or corneal abrasions during my examination.  Cardiovascular:     Rate and Rhythm: Normal rate and regular rhythm.     Pulses: Normal pulses.  Pulmonary:     Effort: Pulmonary effort is normal.  Skin:    General: Skin is warm and dry.  Neurological:      Mental Status: She is alert.  Psychiatric:        Mood and Affect: Mood normal.     ED Results / Procedures / Treatments   Labs (all labs ordered are listed, but only abnormal results are displayed) Labs Reviewed - No data to display  EKG None  Radiology No results found.  Procedures Procedures    Medications Ordered in ED Medications  fluorescein ophthalmic strip 1 strip (1 strip Both Eyes Given 01/17/22 0334)    ED Course/ Medical Decision Making/ A&P                           Medical Decision Making Risk Prescription drug management.   This patient presents to the ED for concern of eye discharge, this involves an extensive number of treatment options, and is a complaint that carries with it a high risk of complications and morbidity.  The differential diagnosis includes bacterial/viral conjunctivitis, orbital cellulitis,    Additional history obtained:  Additional history obtained from N/A External records from outside source obtained and reviewed including oncology notes   Co morbidities that complicate the patient evaluation  N/A  Social Determinants of Health:  N/A    Lab Tests:  I Ordered, and personally interpreted labs.  The pertinent results include:     Visual Acuity  Right Eye Distance: 20/50 (with glasses) Left Eye Distance: 20/30 (with glasses) Bilateral Distance: 20/25 (with glasses) Fluorescein stain is unremarkable no pooling of the eyes noted indurated lesion present.     Imaging Studies ordered:  I ordered imaging studies including n/a  I independently visualized and interpreted imaging which showed n/a I agree with the radiologist interpretation   Cardiac Monitoring:  The patient was maintained on a cardiac monitor.  I personally viewed and interpreted the cardiac monitored which showed an underlying rhythm of: n/a   Medicines ordered and prescription drug management:  I ordered medication including fluorescein  stain I have reviewed the patients home medicines and have made adjustments as needed  Critical Interventions:  N/a   Reevaluation:  Patient has sclera injection suspect viral conjunctivitis will obtain fluorescein stain for rule out of possible corneal ulcer.  Consultations Obtained:  N/a    Test Considered:  N/a    Rule out I have low suspicion for orbital cellulitis or periseptal cellulitis as patient had no pain with EOMs, there is no induration, fluctuance noted on exam around eyes.  Low suspicion for keratitis there is no visible dendritic lesion noted, pending of the fluorescein stain, there is no severe amount of purulent discharge noted.  Low suspicion for hyphema as patient denies recent trauma to the area no blood noted in  the anterior chamber.  I doubt corneal abrasion there is no traumatic injury.   Dispostion and problem list  After consideration of the diagnostic results and the patients response to treatment, I feel that the patent would benefit from discharge.  .             Final Clinical Impression(s) / ED Diagnoses Final diagnoses:  Acute conjunctivitis of both eyes, unspecified acute conjunctivitis type    Rx / DC Orders ED Discharge Orders          Ordered    levofloxacin (QUIXIN) 0.5 % ophthalmic solution  3 times daily        01/17/22 0433              Marcello Fennel, PA-C 01/17/22 0436    Ripley Fraise, MD 01/17/22 [id number]

## 2022-02-21 DIAGNOSIS — Z419 Encounter for procedure for purposes other than remedying health state, unspecified: Secondary | ICD-10-CM | POA: Diagnosis not present

## 2022-03-24 DIAGNOSIS — Z419 Encounter for procedure for purposes other than remedying health state, unspecified: Secondary | ICD-10-CM | POA: Diagnosis not present

## 2022-03-26 ENCOUNTER — Telehealth: Payer: Self-pay

## 2022-03-26 NOTE — Telephone Encounter (Signed)
LVM. AS, CMA

## 2022-04-24 DIAGNOSIS — Z419 Encounter for procedure for purposes other than remedying health state, unspecified: Secondary | ICD-10-CM | POA: Diagnosis not present

## 2022-05-23 DIAGNOSIS — Z419 Encounter for procedure for purposes other than remedying health state, unspecified: Secondary | ICD-10-CM | POA: Diagnosis not present

## 2022-06-23 DIAGNOSIS — Z419 Encounter for procedure for purposes other than remedying health state, unspecified: Secondary | ICD-10-CM | POA: Diagnosis not present

## 2022-07-09 ENCOUNTER — Encounter: Payer: Self-pay | Admitting: Oncology

## 2022-09-19 ENCOUNTER — Encounter: Payer: Self-pay | Admitting: Oncology

## 2022-09-24 NOTE — Progress Notes (Signed)
Subjective:    Meghan [name] - 47 y.o. female MRN [id number]  Date of birth: [birth year]  HPI  Meghan [name] is to establish care and annual physical exam.   Current issues and/or concerns: - Asthma. Reports triggers tend to be sinuses, allergies, weather, and certain perfumes/smells. Needs refills of Albuterol.  - She does not check blood pressure outside of office. She does monitor salt intake. She does exercise outside of office. She does not smoke. She does not excessively consume caffeine. She does not complain of red flag symptoms such as but not limited to chest pain, shortness of breath, worst headache of life, nausea/vomiting.  - No further issues/concerns for discussion today.     ROS per HPI    Health Maintenance:  Health Maintenance Due  Topic Date Due   COVID-19 Vaccine (1) Never done   DTaP/Tdap/Td (1 - Tdap) Never done   PAP SMEAR-Modifier  06/25/2017   Colonoscopy  Never done     Past Medical History: Patient Active Problem List   Diagnosis Date Noted   Anemia 08/04/2018   Headache 04/11/2017   Dyspnea 04/11/2017   Thrombocytopenia (HCC) 04/11/2017   Symptomatic anemia 04/10/2017   Alcohol abuse 03/13/2015   Menorrhagia 02/01/2014   Leukopenia 02/01/2014   Panic disorder without agoraphobia 12/27/2013   Cannabis abuse 08/23/2013   Generalized anxiety disorder 08/23/2013   MDD (major depressive disorder), recurrent episode, moderate (HCC) 08/23/2013   Iron deficiency anemia 04/27/2013   Pancytopenia (HCC) 04/22/2013      Social History   reports that she has never smoked. She has never used smokeless tobacco. She reports current alcohol use. She reports current drug use. Drug: Marijuana.   Family History  family history includes Alcohol abuse in her father; Cancer in her maternal grandmother; Depression in her father and sister; Drug abuse in her brother and sister.   Medications: reviewed and updated   Objective:   Physical Exam BP (!)  152/88   Pulse 61   Temp 97.7 F (36.5 C) (Oral)   Ht 5\' 6"  (1.676 m)   Wt 196 lb 3.2 oz (89 kg)   LMP 09/17/2022 (Exact Date)   SpO2 98%   BMI 31.67 kg/m   Physical Exam HENT:     Head: Normocephalic and atraumatic.     Right Ear: Tympanic membrane, ear canal and external ear normal.     Left Ear: Tympanic membrane, ear canal and external ear normal.     Nose: Nose normal.     Mouth/Throat:     Mouth: Mucous membranes are moist.     Pharynx: Oropharynx is clear.  Eyes:     Extraocular Movements: Extraocular movements intact.     Conjunctiva/sclera: Conjunctivae normal.     Pupils: Pupils are equal, round, and reactive to light.  Cardiovascular:     Rate and Rhythm: Normal rate and regular rhythm.     Pulses: Normal pulses.     Heart sounds: Normal heart sounds.  Pulmonary:     Effort: Pulmonary effort is normal.     Breath sounds: Normal breath sounds.  Chest:     Comments: Patient declined.  Abdominal:     General: Bowel sounds are normal.     Palpations: Abdomen is soft.  Genitourinary:    General: Normal vulva.     Vagina: Normal.     Cervix: Normal.     Uterus: Normal.      Adnexa: Right adnexa normal and left adnexa normal.  Comments: Curley Spice, CMA present.  Musculoskeletal:        General: Normal range of motion.     Right shoulder: Normal.     Left shoulder: Normal.     Right upper arm: Normal.     Left upper arm: Normal.     Right elbow: Normal.     Left elbow: Normal.     Right forearm: Normal.     Left forearm: Normal.     Right wrist: Normal.     Left wrist: Normal.     Right hand: Normal.     Left hand: Normal.     Cervical back: Normal, normal range of motion and neck supple.     Thoracic back: Normal.     Lumbar back: Normal.     Right hip: Normal.     Left hip: Normal.     Right upper leg: Normal.     Left upper leg: Normal.     Right knee: Normal.     Left knee: Normal.     Right lower leg: Normal.     Left lower leg:  Normal.     Right ankle: Normal.     Left ankle: Normal.     Right foot: Normal.     Left foot: Normal.  Skin:    General: Skin is warm and dry.     Capillary Refill: Capillary refill takes less than 2 seconds.  Neurological:     General: No focal deficit present.     Mental Status: She is alert and oriented to person, place, and time.  Psychiatric:        Mood and Affect: Mood normal.        Behavior: Behavior normal.       Assessment & Plan:  1. Encounter to establish care 2. Annual physical exam - Counseled on 150 minutes of exercise per week as tolerated, healthy eating (including decreased daily intake of saturated fats, cholesterol, added sugars, sodium), STI prevention, and routine healthcare maintenance.  3. Screening for metabolic disorder - Routine screening.  - CMP14+EGFR  4. Screening for deficiency anemia - Routine screening.  - CBC  5. Diabetes mellitus screening - Routine screening.  - Hemoglobin A1c  6. Screening cholesterol level - Routine screening.  - Lipid panel  7. Thyroid disorder screen - Routine screening.  - TSH  8. Encounter for screening mammogram for malignant neoplasm of breast - Routine screening.  - MM Digital Screening; Future  9. Pap smear for cervical cancer screening - Routine screening.  - Cytology - PAP([facility] [place])  10. Routine screening for STI (sexually transmitted infection) - Routine screening.  - Cervicovaginal ancillary only  11. Encounter for screening for HIV - Routine screening.  - HIV antibody (with reflex)  12. Screen for colon cancer - Routine screening.  - Ambulatory referral to Gastroenterology  13. Mild intermittent asthma, unspecified whether complicated - Continue Albuterol as prescribed. Counseled on medication adherence/adverse effects.  - Follow-up with primary provider in 3 months or sooner if needed.  - albuterol (VENTOLIN HFA) 108 (90 Base) MCG/ACT inhaler; Inhale 2 puffs into the lungs every  6 (six) hours as needed for wheezing or shortness of breath.  Dispense: 18 g; Refill: 2  14. Elevated blood pressure reading - Blood pressure not at goal during today's visit. Patient asymptomatic without chest pressure, chest pain, palpitations, shortness of breath, worst headache of life, and any additional red flag symptoms. - Follow-up with primary provider in 2 weeks  or sooner if needed for blood pressure check.    Patient was given clear instructions to go to Emergency Department or return to medical center if symptoms don't improve, worsen, or new problems develop.The patient verbalized understanding.  I discussed the assessment and treatment plan with the patient. The patient was provided an opportunity to ask questions and all were answered. The patient agreed with the plan and demonstrated an understanding of the instructions.   The patient was advised to call back or seek an in-person evaluation if the symptoms worsen or if the condition fails to improve as anticipated.    Ricky Stabs, NP 09/26/2022, 9:44 AM Primary Care at [facility]

## 2022-09-26 ENCOUNTER — Encounter: Payer: Self-pay | Admitting: Family

## 2022-09-26 ENCOUNTER — Other Ambulatory Visit (HOSPITAL_COMMUNITY)
Admission: RE | Admit: 2022-09-26 | Discharge: 2022-09-26 | Disposition: A | Discharge status: Home / Self Care | Payer: 59 | Source: Ambulatory Visit | Attending: Family | Admitting: Family

## 2022-09-26 ENCOUNTER — Ambulatory Visit (INDEPENDENT_AMBULATORY_CARE_PROVIDER_SITE_OTHER): Payer: 59 | Admitting: Family

## 2022-09-26 VITALS — BP 152/88 | HR 61 | Temp 97.7°F | Ht 66.0 in | Wt 196.2 lb

## 2022-09-26 DIAGNOSIS — Z0000 Encounter for general adult medical examination without abnormal findings: Secondary | ICD-10-CM

## 2022-09-26 DIAGNOSIS — Z1231 Encounter for screening mammogram for malignant neoplasm of breast: Secondary | ICD-10-CM

## 2022-09-26 DIAGNOSIS — Z1211 Encounter for screening for malignant neoplasm of colon: Secondary | ICD-10-CM

## 2022-09-26 DIAGNOSIS — Z124 Encounter for screening for malignant neoplasm of cervix: Secondary | ICD-10-CM | POA: Insufficient documentation

## 2022-09-26 DIAGNOSIS — J4520 Mild intermittent asthma, uncomplicated: Secondary | ICD-10-CM

## 2022-09-26 DIAGNOSIS — R030 Elevated blood-pressure reading, without diagnosis of hypertension: Secondary | ICD-10-CM

## 2022-09-26 DIAGNOSIS — Z113 Encounter for screening for infections with a predominantly sexual mode of transmission: Secondary | ICD-10-CM

## 2022-09-26 DIAGNOSIS — Z7689 Persons encountering health services in other specified circumstances: Secondary | ICD-10-CM

## 2022-09-26 DIAGNOSIS — Z1329 Encounter for screening for other suspected endocrine disorder: Secondary | ICD-10-CM

## 2022-09-26 DIAGNOSIS — Z131 Encounter for screening for diabetes mellitus: Secondary | ICD-10-CM

## 2022-09-26 DIAGNOSIS — Z13228 Encounter for screening for other metabolic disorders: Secondary | ICD-10-CM

## 2022-09-26 DIAGNOSIS — Z114 Encounter for screening for human immunodeficiency virus [HIV]: Secondary | ICD-10-CM

## 2022-09-26 DIAGNOSIS — Z130 Encounter for screening for diseases of the blood and blood-forming organs and certain disorders involving the immune mechanism: Secondary | ICD-10-CM

## 2022-09-26 DIAGNOSIS — Z13220 Encounter for screening for lipoid disorders: Secondary | ICD-10-CM

## 2022-09-26 MED ORDER — ALBUTEROL SULFATE HFA 108 (90 BASE) MCG/ACT IN AERS: 2.0000 | INHALATION_SPRAY | Freq: Four times a day (QID) | RESPIRATORY_TRACT | 2 refills | Status: DC | PRN

## 2022-09-26 NOTE — Progress Notes (Signed)
Pt is asking for a wellness exam.  Pt needs refill on albuterol.

## 2022-09-26 NOTE — Patient Instructions (Signed)
Preventive Care 40-47 Years Old, Female Preventive care refers to lifestyle choices and visits with your health care provider that can promote health and wellness. Preventive care visits are also called wellness exams. What can I expect for my preventive care visit? Counseling Your health care provider may ask you questions about your: Medical history, including: Past medical problems. Family medical history. Pregnancy history. Current health, including: Menstrual cycle. Method of birth control. Emotional well-being. Home life and relationship well-being. Sexual activity and sexual health. Lifestyle, including: Alcohol, nicotine or tobacco, and drug use. Access to firearms. Diet, exercise, and sleep habits. Work and work environment. Sunscreen use. Safety issues such as seatbelt and bike helmet use. Physical exam Your health care provider will check your: Height and weight. These may be used to calculate your BMI (body mass index). BMI is a measurement that tells if you are at a healthy weight. Waist circumference. This measures the distance around your waistline. This measurement also tells if you are at a healthy weight and may help predict your risk of certain diseases, such as type 2 diabetes and high blood pressure. Heart rate and blood pressure. Body temperature. Skin for abnormal spots. What immunizations do I need?  Vaccines are usually given at various ages, according to a schedule. Your health care provider will recommend vaccines for you based on your age, medical history, and lifestyle or other factors, such as travel or where you work. What tests do I need? Screening Your health care provider may recommend screening tests for certain conditions. This may include: Lipid and cholesterol levels. Diabetes screening. This is done by checking your blood sugar (glucose) after you have not eaten for a while (fasting). Pelvic exam and Pap test. Hepatitis B test. Hepatitis C  test. HIV (human immunodeficiency virus) test. STI (sexually transmitted infection) testing, if you are at risk. Lung cancer screening. Colorectal cancer screening. Mammogram. Talk with your health care provider about when you should start having regular mammograms. This may depend on whether you have a family history of breast cancer. BRCA-related cancer screening. This may be done if you have a family history of breast, ovarian, tubal, or peritoneal cancers. Bone density scan. This is done to screen for osteoporosis. Talk with your health care provider about your test results, treatment options, and if necessary, the need for more tests. Follow these instructions at home: Eating and drinking  Eat a diet that includes fresh fruits and vegetables, whole grains, lean protein, and low-fat dairy products. Take vitamin and mineral supplements as recommended by your health care provider. Do not drink alcohol if: Your health care provider tells you not to drink. You are pregnant, may be pregnant, or are planning to become pregnant. If you drink alcohol: Limit how much you have to 0-1 drink a day. Know how much alcohol is in your drink. In the [place] one drink equals one 12 oz bottle of beer (355 mL), one 5 oz glass of wine (148 mL), or one 1 oz glass of hard liquor (44 mL). Lifestyle Brush your teeth every morning and night with fluoride toothpaste. Floss one time each day. Exercise for at least 30 minutes 5 or more days each week. Do not use any products that contain nicotine or tobacco. These products include cigarettes, chewing tobacco, and vaping devices, such as e-cigarettes. If you need help quitting, ask your health care provider. Do not use drugs. If you are sexually active, practice safe sex. Use a condom or other form of protection to   prevent STIs. If you do not wish to become pregnant, use a form of birth control. If you plan to become pregnant, see your health care provider for a  prepregnancy visit. Take aspirin only as told by your health care provider. Make sure that you understand how much to take and what form to take. Work with your health care provider to find out whether it is safe and beneficial for you to take aspirin daily. Find healthy ways to manage stress, such as: Meditation, yoga, or listening to music. Journaling. Talking to a trusted person. Spending time with friends and family. Minimize exposure to UV radiation to reduce your risk of skin cancer. Safety Always wear your seat belt while driving or riding in a vehicle. Do not drive: If you have been drinking alcohol. Do not ride with someone who has been drinking. When you are tired or distracted. While texting. If you have been using any mind-altering substances or drugs. Wear a helmet and other protective equipment during sports activities. If you have firearms in your house, make sure you follow all gun safety procedures. Seek help if you have been physically or sexually abused. What's next? Visit your health care provider once a year for an annual wellness visit. Ask your health care provider how often you should have your eyes and teeth checked. Stay up to date on all vaccines. This information is not intended to replace advice given to you by your health care provider. Make sure you discuss any questions you have with your health care provider. Document Revised: 09/05/2020 Document Reviewed: 09/05/2020 Elsevier Patient Education  2024 Elsevier Inc.  

## 2022-09-27 LAB — CBC
Hematocrit: 36.1 % (ref 34.0–46.6)
Hemoglobin: 10.4 g/dL — ABNORMAL LOW (ref 11.1–15.9)
MCH: 22.2 pg — ABNORMAL LOW (ref 26.6–33.0)
MCHC: 28.8 g/dL — ABNORMAL LOW (ref 31.5–35.7)
MCV: 77 fL — ABNORMAL LOW (ref 79–97)
Platelets: 188 x10E3/uL (ref 150–450)
RBC: 4.69 x10E6/uL (ref 3.77–5.28)
RDW: 21.5 % — ABNORMAL HIGH (ref 11.7–15.4)
WBC: 3.4 x10E3/uL (ref 3.4–10.8)

## 2022-09-27 LAB — CMP14+EGFR
ALT: 11 IU/L (ref 0–32)
AST: 18 IU/L (ref 0–40)
Albumin: 4.2 g/dL (ref 3.9–4.9)
Alkaline Phosphatase: 70 IU/L (ref 44–121)
BUN/Creatinine Ratio: 17 (ref 9–23)
BUN: 12 mg/dL (ref 6–24)
Bilirubin Total: <0.2 mg/dL (ref 0.0–1.2)
CO2: 22 mmol/L (ref 20–29)
Calcium: 9 mg/dL (ref 8.7–10.2)
Chloride: 106 mmol/L (ref 96–106)
Creatinine, Ser: 0.7 mg/dL (ref 0.57–1.00)
Globulin, Total: 3.2 g/dL (ref 1.5–4.5)
Glucose: 86 mg/dL (ref 70–99)
Potassium: 4 mmol/L (ref 3.5–5.2)
Sodium: 143 mmol/L (ref 134–144)
Total Protein: 7.4 g/dL (ref 6.0–8.5)
eGFR: 108 mL/min/{1.73_m2} (ref >59)

## 2022-09-27 LAB — LIPID PANEL
Chol/HDL Ratio: 3.2 ratio (ref 0.0–4.4)
Cholesterol, Total: 134 mg/dL (ref 100–199)
HDL: 42 mg/dL (ref >39)
LDL Chol Calc (NIH): 81 mg/dL (ref 0–99)
Triglycerides: 46 mg/dL (ref 0–149)
VLDL Cholesterol Cal: 11 mg/dL (ref 5–40)

## 2022-09-27 LAB — HIV ANTIBODY (ROUTINE TESTING W REFLEX): HIV Screen 4th Generation wRfx: NONREACTIVE

## 2022-09-27 LAB — TSH: TSH: 0.913 u[IU]/mL (ref 0.450–4.500)

## 2022-09-27 LAB — HEMOGLOBIN A1C
Est. average glucose Bld gHb Est-mCnc: 105 mg/dL
Hgb A1c MFr Bld: 5.3 % (ref 4.8–5.6)

## 2022-09-29 ENCOUNTER — Other Ambulatory Visit: Payer: Self-pay | Admitting: Family

## 2022-09-29 DIAGNOSIS — N760 Acute vaginitis: Secondary | ICD-10-CM

## 2022-09-29 DIAGNOSIS — A5901 Trichomonal vulvovaginitis: Secondary | ICD-10-CM | POA: Insufficient documentation

## 2022-09-29 DIAGNOSIS — D649 Anemia, unspecified: Secondary | ICD-10-CM

## 2022-09-29 LAB — CERVICOVAGINAL ANCILLARY ONLY
Bacterial Vaginitis (gardnerella): POSITIVE — AB
Candida Glabrata: NEGATIVE
Candida Vaginitis: NEGATIVE
Chlamydia: NEGATIVE
Comment: NEGATIVE
Comment: NEGATIVE
Comment: NEGATIVE
Comment: NEGATIVE
Comment: NEGATIVE
Comment: NORMAL
Neisseria Gonorrhea: NEGATIVE
Trichomonas: POSITIVE — AB

## 2022-09-29 MED ORDER — METRONIDAZOLE 500 MG PO TABS: 500.0000 mg | ORAL_TABLET | Freq: Two times a day (BID) | ORAL | 0 refills | Status: AC

## 2022-09-29 MED ORDER — FERROUS SULFATE 325 (65 FE) MG PO TBEC: 325.0000 mg | DELAYED_RELEASE_TABLET | Freq: Every day | ORAL | 0 refills | Status: AC

## 2022-09-30 LAB — CYTOLOGY - PAP
Comment: NEGATIVE
Diagnosis: NEGATIVE
Diagnosis: REACTIVE
High risk HPV: NEGATIVE

## 2022-10-14 NOTE — Progress Notes (Signed)
Patient ID: Meghan [name], female    DOB: 01-29-1976  MRN: [id number]  CC: Blood Pressure Check  Subjective: Meghan [name] is a 47 y.o. female who presents for blood pressure check.  Her concerns today include:  Reports checking blood pressure on smart watch and usually 130's/80's. She does not have smart watch in office today for comparison. States she doesn't feel like our office blood pressure machine is "accurate". States she doesn't "feel like" she needs blood pressure medication right now because she "feels fine". States family history of high blood pressure may be the cause.  States she is not ready to begin blood pressure medication. States she is not going to take blood pressure medication because "once you start taking it you never stop taking it". She monitors salt intake. She does not complain of red flag symptoms such as but not limited to chest pain, shortness of breath, worst headache of life, nausea/vomiting. No further issues/concerns for discussion today.   Patient Active Problem List   Diagnosis Date Noted   Trichomonas vaginitis 09/29/2022   Bacterial vaginitis 09/29/2022   Anemia 08/04/2018   Headache 04/11/2017   Dyspnea 04/11/2017   Thrombocytopenia (HCC) 04/11/2017   Symptomatic anemia 04/10/2017   Alcohol abuse 03/13/2015   Menorrhagia 02/01/2014   Leukopenia 02/01/2014   Panic disorder without agoraphobia 12/27/2013   Cannabis abuse 08/23/2013   Generalized anxiety disorder 08/23/2013   MDD (major depressive disorder), recurrent episode, moderate (HCC) 08/23/2013   Iron deficiency anemia 04/27/2013   Pancytopenia (HCC) 04/22/2013     Current Outpatient Medications on File Prior to Visit  Medication Sig Dispense Refill   albuterol (VENTOLIN HFA) 108 (90 Base) MCG/ACT inhaler Inhale 2 puffs into the lungs every 6 (six) hours as needed for wheezing or shortness of breath. 18 g 2   ferrous sulfate 325 (65 FE) MG EC tablet Take 1 tablet (325 mg total) by  mouth daily with breakfast. 90 tablet 0   naproxen sodium (ALEVE) 220 MG tablet Take 220 mg by mouth 2 (two) times daily as needed (pain). (Patient not taking: Reported on 09/26/2022)     [DISCONTINUED] mirtazapine (REMERON) 45 MG tablet Take 1 tablet (45 mg total) by mouth at bedtime. (Patient not taking: Reported on 09/11/2018) 30 tablet 1   [DISCONTINUED] SUMAtriptan (IMITREX) 25 MG tablet Take 1 tablet (25 mg total) by mouth every 6 (six) hours as needed for migraine or headache. May repeat in 2 hours if headache persists or recurs. (Patient not taking: Reported on 08/05/2018) 30 tablet 0   [DISCONTINUED] topiramate (TOPAMAX) 25 MG tablet Take 25 mg daily for 1 week; then changed to 25 mg BID. (Patient not taking: Reported on 08/05/2018) 60 tablet 1   No current facility-administered medications on file prior to visit.    Allergies  Allergen Reactions   Tomato Hives    Social History   Socioeconomic History   Marital status: Single    Spouse name: Not on file   Number of children: Not on file   Years of education: Not on file   Highest education level: Not on file  Occupational History   Not on file  Tobacco Use   Smoking status: Never   Smokeless tobacco: Never  Vaping Use   Vaping status: Never Used  Substance and Sexual Activity   Alcohol use: Yes    Comment: no alcohol in last one week- was drinking about 1/5th of liquior daily for 2 months   Drug use: Yes  Types: Marijuana    Comment: daily   Sexual activity: Not Currently  Other Topics Concern   Not on file  Social History Narrative   Not on file   Social Determinants of Health   Financial Resource Strain: Not on file  Food Insecurity: Not on file  Transportation Needs: Not on file  Physical Activity: Not on file  Stress: Not on file  Social Connections: Not on file  Intimate Partner Violence: Not on file    Family History  Problem Relation Age of Onset   Alcohol abuse Father    Depression Father    Drug  abuse Sister    Depression Sister    Drug abuse Brother    Cancer Maternal Grandmother        leukemia   Suicidality Neg Hx     Past Surgical History:  Procedure Laterality Date   BARTHOLIN CYST MARSUPIALIZATION N/A 04/10/2014   Procedure: INCISION AND DRAINAGE OF BARTHOLIN ABCESS ;  Surgeon: Essie Hart, MD;  Location:  [facility];  Service: Gynecology;  Laterality: N/A;   CESAREAN SECTION  x3  last one 01-17-2005   last one with bilateral tubal ligation    DILATION AND CURETTAGE OF UTERUS  1999    ROS: Review of Systems Negative except as stated above  PHYSICAL EXAM: BP (!) 149/96   Pulse 60   Temp 98.1 F (36.7 C) (Oral)   Ht 5' 6.5" (1.689 m)   Wt 198 lb 6.4 oz (90 kg)   LMP 10/13/2022 (Exact Date)   SpO2 96%   BMI 31.54 kg/m   Physical Exam HENT:     Head: Normocephalic and atraumatic.     Nose: Nose normal.     Mouth/Throat:     Mouth: Mucous membranes are moist.     Pharynx: Oropharynx is clear.  Eyes:     Extraocular Movements: Extraocular movements intact.     Conjunctiva/sclera: Conjunctivae normal.     Pupils: Pupils are equal, round, and reactive to light.  Cardiovascular:     Rate and Rhythm: Normal rate and regular rhythm.     Pulses: Normal pulses.     Heart sounds: Normal heart sounds.  Pulmonary:     Effort: Pulmonary effort is normal.     Breath sounds: Normal breath sounds.  Musculoskeletal:     Cervical back: Normal range of motion and neck supple.  Neurological:     General: No focal deficit present.     Mental Status: She is alert and oriented to person, place, and time.  Psychiatric:        Mood and Affect: Mood normal.        Behavior: Behavior normal.      ASSESSMENT AND PLAN: 1. Elevated blood pressure reading 2. Blood pressure check - Blood pressure not at goal during today's visit. Patient asymptomatic without chest pressure, chest pain, palpitations, shortness of breath, worst headache of life, and any  additional red flag symptoms. - Patient declined beginning blood pressure medication. I discussed with patient in detail life risks associated with uncontrolled high blood pressure. Patient verbalized understanding.  - Follow-up with primary provider as scheduled.   Patient was given the opportunity to ask questions.  Patient verbalized understanding of the plan and was able to repeat key elements of the plan. Patient was given clear instructions to go to Emergency Department or return to medical center if symptoms don't improve, worsen, or new problems develop.The patient verbalized understanding.   Follow-up with  primary provider as scheduled.   Rema Fendt, NP

## 2022-10-15 ENCOUNTER — Encounter: Payer: Self-pay | Admitting: Family

## 2022-10-15 ENCOUNTER — Ambulatory Visit (INDEPENDENT_AMBULATORY_CARE_PROVIDER_SITE_OTHER): Payer: 59 | Admitting: Family

## 2022-10-15 VITALS — BP 149/96 | HR 60 | Temp 98.1°F | Ht 66.5 in | Wt 198.4 lb

## 2022-10-15 DIAGNOSIS — Z0130 Encounter for examination of blood pressure without abnormal findings: Secondary | ICD-10-CM

## 2022-10-15 DIAGNOSIS — R030 Elevated blood-pressure reading, without diagnosis of hypertension: Secondary | ICD-10-CM | POA: Diagnosis not present

## 2022-10-28 ENCOUNTER — Encounter: Payer: Self-pay | Admitting: Oncology

## 2022-10-29 ENCOUNTER — Telehealth: Payer: Self-pay | Admitting: *Deleted

## 2022-10-29 ENCOUNTER — Ambulatory Visit: Payer: 59

## 2022-10-29 NOTE — Progress Notes (Unsigned)
Pt's name and DOB verified at the beginning of the pre-visit.  Pt denies any difficulty with ambulating,sitting, laying down or rolling side to side Gave both [facility] main # and MD on call # prior to instructions.  No egg or soy allergy known to patient  No issues known to pt with past sedation with any surgeries or procedures Pt denies having issues being intubated Patient denies ever being intubated Pt has no issues moving head neck or swallowing No FH of Malignant Hyperthermia Pt is not on diet pills Pt is not on home 02  Pt is not on blood thinners  Pt denies issues with constipation  Pt has frequent issues with constipation RN instructed pt to use Miralax per bottles instructions a week before prep days. Pt states they will Pt is not on dialysis Pt denise any abnormal heart rhythms  Pt denies any upcoming cardiac testing Pt encouraged to use to use Singlecare or Goodrx to reduce cost  Patient's chart reviewed by John Nulty CNRA prior to pre-visit and patient appropriate for the [facility].  Pre-visit completed and red dot placed by patient's name on their procedure day (on provider's schedule).  . Visit by phone Pt states weight is  Instructed pt why it is important to and  to call if they have any changes in health or new medications. Directed them to the # given and on instructions.   Pt states they will.  Instructions reviewed with pt and pt states understanding. Instructed to review again prior to procedure. Pt states they will.  Instructions sent by mail with coupon and by my chart   

## 2022-10-29 NOTE — Telephone Encounter (Signed)
Attempted to reach pt with only # listed in profile. Unable to LM due to VM being full. Will attempt to reach pt again in 5 min with only # listed. If unable to reach RN will follow protocol for this issue at end of day ie. Cancel procedure. Second attempt no naswer and VM was full.

## 2022-11-20 ENCOUNTER — Encounter: Payer: Self-pay | Admitting: Gastroenterology

## 2022-11-20 ENCOUNTER — Encounter: Payer: Managed Care, Other (non HMO) | Admitting: Gastroenterology

## 2022-12-18 ENCOUNTER — Ambulatory Visit: Payer: 59 | Admitting: *Deleted

## 2022-12-18 VITALS — Ht 66.5 in | Wt 200.0 lb

## 2022-12-18 DIAGNOSIS — Z1211 Encounter for screening for malignant neoplasm of colon: Secondary | ICD-10-CM

## 2022-12-18 MED ORDER — NA SULFATE-K SULFATE-MG SULF 17.5-3.13-1.6 GM/177ML PO SOLN: 1.0000 | Freq: Once | ORAL | 0 refills | Status: AC

## 2022-12-18 NOTE — Progress Notes (Signed)
Pt's name and DOB verified at the beginning of the pre-visit.  Pt denies any difficulty with ambulating,sitting, laying down or rolling side to side Gave both [facility] main # and MD on call # prior to instructions.  No egg or soy allergy known to patient  No issues known to pt with past sedation with any surgeries or procedures Pt denies having issues being intubated Pt has no issues moving head neck or swallowing No FH of Malignant Hyperthermia Pt is not on diet pills Pt is not on home 02  Pt is not on blood thinners  Pt denies issues with constipation  Pt is not on dialysis Pt denise any abnormal heart rhythms  Pt denies any upcoming cardiac testing Pt encouraged to use to use Singlecare or Goodrx to reduce cost  Patient's chart reviewed by Cathlyn Parsons CNRA prior to pre-visit and patient appropriate for the [facility].  Pre-visit completed and red dot placed by patient's name on their procedure day (on provider's schedule).  . Visit by phone Pt states weight is 200 lb Instructed pt why it is important to and  to call if they have any changes in health or new medications. Directed them to the # given and on instructions.   Pt states they will.  Instructions reviewed with pt and pt states understanding. Instructed to review again prior to procedure. Pt states they will.  Instructions sent by mail with coupon and by my chart

## 2023-01-08 ENCOUNTER — Ambulatory Visit: Payer: 59 | Admitting: Gastroenterology

## 2023-01-08 ENCOUNTER — Encounter: Payer: Self-pay | Admitting: Gastroenterology

## 2023-01-08 VITALS — BP 155/71 | HR 60 | Temp 97.3°F | Resp 12 | Ht 66.5 in | Wt 200.0 lb

## 2023-01-08 DIAGNOSIS — Z1211 Encounter for screening for malignant neoplasm of colon: Secondary | ICD-10-CM | POA: Diagnosis present

## 2023-01-08 MED ORDER — SODIUM CHLORIDE 0.9 % IV SOLN: 500.0000 mL | Freq: Once | INTRAVENOUS | Status: DC

## 2023-01-08 NOTE — Op Note (Signed)
[facility] Patient Name: Donald Jacque Procedure Date: 01/08/2023 3:11 PM MRN: [id number] Endoscopist: Sherilyn Cooter L. Myrtie Neither , MD, [id number] Age: 47 Referring MD:  Date of Birth: November 26, 1975 Gender: Female Account #: [id number] Procedure:                Colonoscopy Indications:              Screening for colorectal malignant neoplasm, This                            is the patient's first colonoscopy Medicines:                Monitored Anesthesia Care Procedure:                Pre-Anesthesia Assessment:                           - Prior to the procedure, a History and Physical                            was performed, and patient medications and                            allergies were reviewed. The patient's tolerance of                            previous anesthesia was also reviewed. The risks                            and benefits of the procedure and the sedation                            options and risks were discussed with the patient.                            All questions were answered, and informed consent                            was obtained. Prior Anticoagulants: The patient has                            taken no anticoagulant or antiplatelet agents. ASA                            Grade Assessment: II - A patient with mild systemic                            disease. After reviewing the risks and benefits,                            the patient was deemed in satisfactory condition to                            undergo the procedure.  After obtaining informed consent, the colonoscope                            was passed under direct vision. Throughout the                            procedure, the patient's blood pressure, pulse, and                            oxygen saturations were monitored continuously. The                            Olympus Scope SN [id number] was introduced through the                            anus and advanced  to the the terminal ileum, with                            identification of the appendiceal orifice and IC                            valve. The colonoscopy was performed without                            difficulty. The patient tolerated the procedure                            well. The quality of the bowel preparation was                            excellent. The terminal ileum, ileocecal valve,                            appendiceal orifice, and rectum were photographed. Scope In: 3:40:06 PM Scope Out: 3:52:15 PM Scope Withdrawal Time: 0 hours 9 minutes 7 seconds  Total Procedure Duration: 0 hours 12 minutes 9 seconds  Findings:                 The perianal and digital rectal examinations were                            normal.                           The terminal ileum appeared normal.                           The entire examined colon appeared normal on direct                            and retroflexion views. Complications:            No immediate complications. Estimated Blood Loss:     Estimated blood loss: none. Impression:               - The examined portion  of the ileum was normal.                           - The entire examined colon is normal on direct and                            retroflexion views.                           - No specimens collected. Recommendation:           - Patient has a contact number available for                            emergencies. The signs and symptoms of potential                            delayed complications were discussed with the                            patient. Return to normal activities tomorrow.                            Written discharge instructions were provided to the                            patient.                           - Resume previous diet.                           - Continue present medications.                           - Repeat colonoscopy in 10 years for screening                             purposes. Veryl Winemiller L. Myrtie Neither, MD 01/08/2023 3:54:31 PM This report has been signed electronically.

## 2023-01-08 NOTE — Patient Instructions (Signed)

## 2023-01-08 NOTE — Progress Notes (Signed)
Uneventful anesthetic. Report to pacu rn. Vss. Care resumed by rn. 

## 2023-01-08 NOTE — Progress Notes (Signed)
History and Physical:  This patient presents for endoscopic testing for: Encounter Diagnosis  Name Primary?   Special screening for malignant neoplasms, colon Yes    Average risk for colorectal cancer.  First screening exam.  Patient denies chronic abdominal pain, rectal bleeding, constipation or diarrhea.   Patient is otherwise without complaints or active issues today.   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;  Service: Gynecology;  Laterality: N/A;   CESAREAN SECTION  x3  last one 01-17-2005   last one with bilateral tubal ligation    CESAREAN SECTION     x 3   COMBINED HYSTEROSCOPY DIAGNOSTIC / D&C     DILATION AND CURETTAGE OF UTERUS  03/24/1997    Allergies: Allergies  Allergen Reactions   Tomato Hives    Outpatient Meds: Current Outpatient Medications  Medication Sig Dispense Refill   ferrous sulfate 325 (65 FE) MG EC tablet Take 1 tablet (325 mg total) by mouth daily with breakfast. 90 tablet 0   albuterol (VENTOLIN HFA) 108 (90 Base) MCG/ACT inhaler Inhale 2 puffs into the lungs every 6 (six) hours as needed for wheezing or shortness of breath. 18 g 2   naproxen sodium (ALEVE) 220 MG tablet Take 220 mg by mouth 2 (two) times daily as needed (pain). (Patient not taking: Reported on 12/18/2022)     Current Facility-Administered  Medications  Medication Dose Route Frequency Provider Last Rate Last Admin   0.9 %  sodium chloride infusion  500 mL Intravenous Once Danis, Andreas Blower, MD          ___________________________________________________________________ Objective   Exam:  BP (!) 183/95   Pulse (!) 59   Temp (!) 97.3 F (36.3 C) (Temporal)   Resp 17   Ht 5' 6.5" (1.689 m)   Wt 200 lb (90.7 kg)   SpO2 100%   BMI 31.80 kg/m   CV: regular , S1/S2 Resp: clear to auscultation bilaterally, normal RR and effort noted GI: soft, no tenderness, with active bowel sounds.   Assessment: Encounter Diagnosis  Name Primary?   Special screening for malignant neoplasms, colon Yes     Plan: Colonoscopy   The benefits and risks of the planned procedure were described in detail with the patient or (when appropriate) their health care proxy.  Risks were outlined as including, but not limited to, bleeding, infection, perforation, adverse medication reaction leading to cardiac or pulmonary decompensation, pancreatitis (if ERCP).  The limitation of incomplete mucosal visualization was also discussed.  No guarantees or warranties were given.  The patient is appropriate for an endoscopic procedure in the ambulatory setting.   - Amada Jupiter, MD

## 2023-01-08 NOTE — Progress Notes (Signed)
Pt's states no medical or surgical changes since previsit or office visit. 

## 2023-01-09 ENCOUNTER — Telehealth: Payer: Self-pay | Admitting: *Deleted

## 2023-01-09 NOTE — Telephone Encounter (Signed)
  Follow up Call-     01/08/2023    3:13 PM  Call back number  Post procedure Call Back phone  # [phone]  Permission to leave phone message Yes     Patient questions:   Message left to call us if necessary.

## 2023-09-28 ENCOUNTER — Encounter: Payer: Self-pay | Admitting: Family

## 2023-09-28 ENCOUNTER — Ambulatory Visit (INDEPENDENT_AMBULATORY_CARE_PROVIDER_SITE_OTHER): Payer: Self-pay | Admitting: Family

## 2023-09-28 VITALS — BP 155/97 | HR 58 | Temp 98.2°F | Resp 16 | Ht 67.0 in | Wt 197.4 lb

## 2023-09-28 DIAGNOSIS — Z13228 Encounter for screening for other metabolic disorders: Secondary | ICD-10-CM

## 2023-09-28 DIAGNOSIS — Z130 Encounter for screening for diseases of the blood and blood-forming organs and certain disorders involving the immune mechanism: Secondary | ICD-10-CM | POA: Diagnosis not present

## 2023-09-28 DIAGNOSIS — Z0000 Encounter for general adult medical examination without abnormal findings: Secondary | ICD-10-CM | POA: Diagnosis not present

## 2023-09-28 DIAGNOSIS — Z1329 Encounter for screening for other suspected endocrine disorder: Secondary | ICD-10-CM

## 2023-09-28 DIAGNOSIS — Z1231 Encounter for screening mammogram for malignant neoplasm of breast: Secondary | ICD-10-CM

## 2023-09-28 DIAGNOSIS — Z13220 Encounter for screening for lipoid disorders: Secondary | ICD-10-CM

## 2023-09-28 DIAGNOSIS — Z131 Encounter for screening for diabetes mellitus: Secondary | ICD-10-CM

## 2023-09-28 NOTE — Progress Notes (Signed)
 Patient scored a 19 on the PHQ-9, Patient scored a 21 on the GAD-7

## 2023-09-28 NOTE — Progress Notes (Signed)
 Patient ID: HANNAHMARIE [name], female    DOB: [birth year]  MRN: [id number]  CC: Annual Exam  Subjective: Meghan [name] is a 48 y.o. female who presents for annual exam.   Her concerns today include:  - Due for mammogram. - Reports up to date on cervical cancer screening and colon cancer screening. - Anxiety depression. Doing well on medication regimen, no issues/concerns. Established with Psychiatry. She denies thoughts of self-harm, suicidal ideations, homicidal ideations.  Patient Active Problem List   Diagnosis Date Noted   Trichomonas vaginitis 09/29/2022   Bacterial vaginitis 09/29/2022   Anemia 08/04/2018   Headache 04/11/2017   Dyspnea 04/11/2017   Thrombocytopenia (HCC) 04/11/2017   Symptomatic anemia 04/10/2017   Alcohol abuse 03/13/2015   Menorrhagia 02/01/2014   Leukopenia 02/01/2014   Panic disorder without agoraphobia 12/27/2013   Cannabis abuse 08/23/2013   Generalized anxiety disorder 08/23/2013   MDD (major depressive disorder), recurrent episode, moderate (HCC) 08/23/2013   Iron  deficiency anemia 04/27/2013   Pancytopenia (HCC) 04/22/2013     Current Outpatient Medications on File Prior to Visit  Medication Sig Dispense Refill   albuterol  (VENTOLIN  HFA) 108 (90 Base) MCG/ACT inhaler Inhale 2 puffs into the lungs every 6 (six) hours as needed for wheezing or shortness of breath. 18 g 2   ferrous sulfate  325 (65 FE) MG EC tablet Take 1 tablet (325 mg total) by mouth daily with breakfast. 90 tablet 0   naproxen  sodium (ALEVE ) 220 MG tablet Take 220 mg by mouth 2 (two) times daily as needed (pain). (Patient not taking: Reported on 12/18/2022)     [DISCONTINUED] mirtazapine  (REMERON ) 45 MG tablet Take 1 tablet (45 mg total) by mouth at bedtime. (Patient not taking: Reported on 09/11/2018) 30 tablet 1   [DISCONTINUED] SUMAtriptan  (IMITREX ) 25 MG tablet Take 1 tablet (25 mg total) by mouth every 6 (six) hours as needed for migraine or headache. May repeat in 2 hours if  headache persists or recurs. (Patient not taking: Reported on 08/05/2018) 30 tablet 0   [DISCONTINUED] topiramate  (TOPAMAX ) 25 MG tablet Take 25 mg daily for 1 week; then changed to 25 mg BID. (Patient not taking: Reported on 08/05/2018) 60 tablet 1   No current facility-administered medications on file prior to visit.    Allergies  Allergen Reactions   Tomato Hives    Social History   Socioeconomic History   Marital status: Single    Spouse name: Not on file   Number of children: Not on file   Years of education: Not on file   Highest education level: Not on file  Occupational History   Not on file  Tobacco Use   Smoking status: Never   Smokeless tobacco: Never  Vaping Use   Vaping status: Never Used  Substance and Sexual Activity   Alcohol use: Yes    Comment: no alcohol in last one week- was drinking about 1/5th of liquior daily for 2 months   Drug use: Not Currently    Types: Marijuana    Comment: daily   Sexual activity: Not Currently    Birth control/protection: Surgical  Other Topics Concern   Not on file  Social History Narrative   Not on file   Social Drivers of Health   Financial Resource Strain: Low Risk  (09/28/2023)   Overall Financial Resource Strain (CARDIA)    Difficulty of Paying Living Expenses: Not hard at all  Food Insecurity: No Food Insecurity (09/28/2023)   Hunger Vital Sign  Worried About Programme researcher, broadcasting/film/video in the Last Year: Never true    Ran Out of Food in the Last Year: Never true  Transportation Needs: No Transportation Needs (09/28/2023)   PRAPARE - Administrator, Civil Service (Medical): No    Lack of Transportation (Non-Medical): No  Physical Activity: Insufficiently Active (09/28/2023)   Exercise Vital Sign    Days of Exercise per Week: 2 days    Minutes of Exercise per Session: 30 min  Stress: No Stress Concern Present (09/28/2023)   [employer] of Occupational Health - Occupational Stress Questionnaire    Feeling of  Stress: Only a little  Social Connections: Moderately Isolated (09/28/2023)   Social Connection and Isolation Panel    Frequency of Communication with Friends and Family: More than three times a week    Frequency of Social Gatherings with Friends and Family: Twice a week    Attends Religious Services: More than 4 times per year    Active Member of [employer] or Organizations: No    Attends Banker Meetings: Never    Marital Status: Never married  Intimate Partner Violence: Not At Risk (09/28/2023)   Humiliation, Afraid, Rape, and Kick questionnaire    Fear of Current or Ex-Partner: No    Emotionally Abused: No    Physically Abused: No    Sexually Abused: No    Family History  Problem Relation Age of Onset   Alcohol abuse Father    Depression Father    Drug abuse Sister    Depression Sister    Drug abuse Brother    Cancer Maternal Grandmother        leukemia   Suicidality Neg Hx    Colon cancer Neg Hx    Colon polyps Neg Hx    Esophageal cancer Neg Hx    Stomach cancer Neg Hx    Rectal cancer Neg Hx     Past Surgical History:  Procedure Laterality Date   BARTHOLIN CYST MARSUPIALIZATION N/A 04/10/2014   Procedure: INCISION AND DRAINAGE OF BARTHOLIN ABCESS ;  Surgeon: Gigi Botts, MD;  Location: [facility];  Service: Gynecology;  Laterality: N/A;   CESAREAN SECTION  x3  last one 01-17-2005   last one with bilateral tubal ligation    CESAREAN SECTION     x 3   COMBINED HYSTEROSCOPY DIAGNOSTIC / D&C     DILATION AND CURETTAGE OF UTERUS  03/24/1997    ROS: Review of Systems Negative except as stated above  PHYSICAL EXAM: BP (!) 155/97   Pulse (!) 58   Temp 98.2 F (36.8 C) (Oral)   Resp 16   Ht 5' 7 (1.702 m)   Wt 197 lb 6.4 oz (89.5 kg)   LMP 06/11/2023 (Approximate)   SpO2 96%   BMI 30.92 kg/m   Physical Exam HENT:     Head: Normocephalic and atraumatic.     Right Ear: Tympanic membrane, ear canal and external ear normal.     Left  Ear: Tympanic membrane, ear canal and external ear normal.     Nose: Nose normal.     Mouth/Throat:     Mouth: Mucous membranes are moist.     Pharynx: Oropharynx is clear.  Eyes:     Extraocular Movements: Extraocular movements intact.     Conjunctiva/sclera: Conjunctivae normal.     Pupils: Pupils are equal, round, and reactive to light.  Neck:     Thyroid: No thyroid mass, thyromegaly or thyroid  tenderness.  Cardiovascular:     Rate and Rhythm: Bradycardia present.     Pulses: Normal pulses.     Heart sounds: Normal heart sounds.  Pulmonary:     Effort: Pulmonary effort is normal.     Breath sounds: Normal breath sounds.  Chest:     Comments: Patient declined. Abdominal:     General: Bowel sounds are normal.     Palpations: Abdomen is soft.  Genitourinary:    Comments: Patient declined. Musculoskeletal:        General: Normal range of motion.     Right shoulder: Normal.     Left shoulder: Normal.     Right upper arm: Normal.     Left upper arm: Normal.     Right elbow: Normal.     Left elbow: Normal.     Right forearm: Normal.     Left forearm: Normal.     Right wrist: Normal.     Left wrist: Normal.     Right hand: Normal.     Left hand: Normal.     Cervical back: Normal, normal range of motion and neck supple.     Thoracic back: Normal.     Lumbar back: Normal.     Right hip: Normal.     Left hip: Normal.     Right upper leg: Normal.     Left upper leg: Normal.     Right knee: Normal.     Left knee: Normal.     Right lower leg: Normal.     Left lower leg: Normal.     Right ankle: Normal.     Left ankle: Normal.     Right foot: Normal.     Left foot: Normal.  Skin:    General: Skin is warm and dry.     Capillary Refill: Capillary refill takes less than 2 seconds.  Neurological:     General: No focal deficit present.     Mental Status: She is alert and oriented to person, place, and time.  Psychiatric:        Mood and Affect: Mood normal.         Behavior: Behavior normal.     ASSESSMENT AND PLAN: 1. Annual physical exam (Primary) - Counseled on 150 minutes of exercise per week as tolerated, healthy eating (including decreased daily intake of saturated fats, cholesterol, added sugars, sodium), STI prevention, and routine healthcare maintenance.  2. Screening for metabolic disorder - Routine screening.  - CMP14+EGFR  3. Screening for deficiency anemia - Routine screening.  - CBC  4. Diabetes mellitus screening - Routine screening.  - Hemoglobin A1c  5. Screening cholesterol level - Routine screening.  - Lipid panel  6. Thyroid disorder screen - Routine screening.  - TSH  7. Encounter for screening mammogram for malignant neoplasm of breast - Routine screening.  - MM Digital Screening; Future   Patient was given the opportunity to ask questions.  Patient verbalized understanding of the plan and was able to repeat key elements of the plan. Patient was given clear instructions to go to Emergency Department or return to medical center if symptoms don't improve, worsen, or new problems develop.The patient verbalized understanding.   Orders Placed This Encounter  Procedures   MM Digital Screening   CBC   Lipid panel   CMP14+EGFR   Hemoglobin A1c   TSH    Return in about 1 year (around 09/27/2024) for Physical per patient preference.  Greig JINNY Drones, NP

## 2023-09-29 ENCOUNTER — Ambulatory Visit: Payer: Self-pay | Admitting: Family

## 2023-09-29 DIAGNOSIS — Z130 Encounter for screening for diseases of the blood and blood-forming organs and certain disorders involving the immune mechanism: Secondary | ICD-10-CM

## 2023-09-29 LAB — CMP14+EGFR
ALT: 5 IU/L (ref 0–32)
AST: 16 IU/L (ref 0–40)
Albumin: 4 g/dL (ref 3.9–4.9)
Alkaline Phosphatase: 65 IU/L (ref 44–121)
BUN/Creatinine Ratio: 8 — ABNORMAL LOW (ref 9–23)
BUN: 5 mg/dL — ABNORMAL LOW (ref 6–24)
Bilirubin Total: 0.2 mg/dL (ref 0.0–1.2)
CO2: 20 mmol/L (ref 20–29)
Calcium: 9.2 mg/dL (ref 8.7–10.2)
Chloride: 107 mmol/L — ABNORMAL HIGH (ref 96–106)
Creatinine, Ser: 0.62 mg/dL (ref 0.57–1.00)
Globulin, Total: 2.7 g/dL (ref 1.5–4.5)
Glucose: 86 mg/dL (ref 70–99)
Potassium: 4.5 mmol/L (ref 3.5–5.2)
Sodium: 139 mmol/L (ref 134–144)
Total Protein: 6.7 g/dL (ref 6.0–8.5)
eGFR: 110 mL/min/1.73 (ref >59)

## 2023-09-29 LAB — CBC
Hematocrit: 34.8 % (ref 34.0–46.6)
Hemoglobin: 9.8 g/dL — ABNORMAL LOW (ref 11.1–15.9)
MCH: 22.2 pg — ABNORMAL LOW (ref 26.6–33.0)
MCHC: 28.2 g/dL — ABNORMAL LOW (ref 31.5–35.7)
MCV: 79 fL (ref 79–97)
Platelets: 647 x10E3/uL — ABNORMAL HIGH (ref 150–450)
RBC: 4.42 x10E6/uL (ref 3.77–5.28)
RDW: 27.1 % — ABNORMAL HIGH (ref 11.7–15.4)
WBC: 4.2 x10E3/uL (ref 3.4–10.8)

## 2023-09-29 LAB — LIPID PANEL
Chol/HDL Ratio: 3 ratio (ref 0.0–4.4)
Cholesterol, Total: 119 mg/dL (ref 100–199)
HDL: 40 mg/dL (ref >39)
LDL Chol Calc (NIH): 65 mg/dL (ref 0–99)
Triglycerides: 68 mg/dL (ref 0–149)
VLDL Cholesterol Cal: 14 mg/dL (ref 5–40)

## 2023-09-29 LAB — TSH: TSH: 1.83 u[IU]/mL (ref 0.450–4.500)

## 2023-09-29 LAB — HEMOGLOBIN A1C
Est. average glucose Bld gHb Est-mCnc: 105 mg/dL
Hgb A1c MFr Bld: 5.3 % (ref 4.8–5.6)

## 2023-10-14 ENCOUNTER — Encounter: Payer: Self-pay | Admitting: Family

## 2023-10-23 ENCOUNTER — Other Ambulatory Visit: Payer: Self-pay | Admitting: Family

## 2023-10-23 DIAGNOSIS — Z114 Encounter for screening for human immunodeficiency virus [HIV]: Secondary | ICD-10-CM

## 2023-10-23 NOTE — Telephone Encounter (Signed)
 Schedule lab only appointment for HIV lab collection.

## 2024-02-25 ENCOUNTER — Encounter: Payer: Self-pay | Admitting: Family

## 2024-02-26 ENCOUNTER — Other Ambulatory Visit: Payer: Self-pay | Admitting: Family

## 2024-02-26 DIAGNOSIS — J4520 Mild intermittent asthma, uncomplicated: Secondary | ICD-10-CM

## 2024-02-26 MED ORDER — ALBUTEROL SULFATE HFA 108 (90 BASE) MCG/ACT IN AERS: 2.0000 | INHALATION_SPRAY | Freq: Four times a day (QID) | RESPIRATORY_TRACT | 2 refills | Status: AC | PRN

## 2024-02-26 NOTE — Telephone Encounter (Signed)
-   Albuterol  inhaler prescribed (02/26/2024  9:09 AM EST).  - There is no active anxiety depression medication listed on patient's medication list. Schedule appointment. During the interim report to the Emergency Department/Urgent Care/call 911 for immediate medical evaluation.

## 2024-09-27 ENCOUNTER — Encounter: Admitting: Family
